# Patient Record
Sex: Male | Born: 1955
Health system: Southern US, Community
[De-identification: ages and names within clinical notes are randomized; demographics above are authoritative.]

## PROBLEM LIST (undated history)

## (undated) DIAGNOSIS — M109 Gout, unspecified: Secondary | ICD-10-CM

## (undated) DIAGNOSIS — F32A Depression, unspecified: Secondary | ICD-10-CM

## (undated) DIAGNOSIS — F329 Major depressive disorder, single episode, unspecified: Secondary | ICD-10-CM

## (undated) HISTORY — DX: Gout, unspecified: M10.9

## (undated) HISTORY — DX: Depression, unspecified: F32.A

---
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
## 2016-09-15 LAB — BASIC METABOLIC PANEL
BUN: 16 (ref 4–21)
CREATININE: 1 (ref 0.6–1.3)
Glucose: 83
POTASSIUM: 4.3 (ref 3.4–5.3)
Sodium: 141 (ref 137–147)

## 2016-09-15 LAB — LIPID PANEL
CHOLESTEROL: 155 (ref 0–200)
HDL: 50 (ref 35–70)
LDL Cholesterol: 86
TRIGLYCERIDES: 95 (ref 40–160)

## 2016-09-15 LAB — HEPATIC FUNCTION PANEL
ALT: 16 (ref 10–40)
AST: 12 — AB (ref 14–40)
Alkaline Phosphatase: 60 (ref 25–125)
Bilirubin, Total: 0.5

## 2016-09-15 LAB — HEMOGLOBIN A1C: HEMOGLOBIN A1C: 5.4

## 2017-05-22 ENCOUNTER — Ambulatory Visit: Payer: Federal, State, Local not specified - PPO | Admitting: Family Medicine

## 2017-05-22 ENCOUNTER — Encounter: Payer: Self-pay | Admitting: Family Medicine

## 2017-05-22 VITALS — BP 122/64 | HR 64 | Temp 97.7°F | Ht 68.0 in | Wt 206.0 lb

## 2017-05-22 DIAGNOSIS — M25562 Pain in left knee: Secondary | ICD-10-CM

## 2017-05-22 NOTE — Patient Instructions (Signed)
Please follow-up with Dr. Berline Choughigby to further assess your knee pain.  Please place compression to the area.  You can also use ice 2-3 times daily.  Come back soon for your annual physical.  Take care, Dr. Jimmey RalphParker
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
## 2017-05-23 ENCOUNTER — Encounter: Payer: Self-pay | Admitting: Sports Medicine

## 2017-05-23 ENCOUNTER — Ambulatory Visit: Payer: Self-pay

## 2017-05-23 ENCOUNTER — Ambulatory Visit: Payer: Federal, State, Local not specified - PPO | Admitting: Sports Medicine

## 2017-05-23 VITALS — BP 110/66 | HR 65 | Ht 68.0 in | Wt 205.2 lb

## 2017-05-23 DIAGNOSIS — S8002XA Contusion of left knee, initial encounter: Secondary | ICD-10-CM

## 2017-05-23 DIAGNOSIS — M25562 Pain in left knee: Secondary | ICD-10-CM

## 2017-05-23 DIAGNOSIS — M25569 Pain in unspecified knee: Secondary | ICD-10-CM | POA: Insufficient documentation

## 2017-05-23 MED ORDER — DICLOFENAC SODIUM 2 % TD SOLN: 1.0000 "application " | Freq: Two times a day (BID) | TRANSDERMAL | 2 refills | Status: DC

## 2017-05-23 MED ORDER — DICLOFENAC SODIUM 2 % TD SOLN: 1.0000 "application " | Freq: Two times a day (BID) | TRANSDERMAL | 0 refills | Status: AC
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
## 2017-05-23 NOTE — Procedures (Signed)
LIMITED MSK ULTRASOUND OF left knee Images were obtained and interpreted by myself, Gaspar BiddingMichael Emelda Kohlbeck, DO  Images have been saved and stored to PACS system. Images obtained on: GE S7 Ultrasound machine  FINDINGS:  Patella & Patellar Tendon: Normal with bossing of the tibial tubercle consistent with prior Osgood-Schlatter's disease with possible small component of retropatellar bursitis Quad & Quad Tendon: Normal Suprapatellar Pouch: Normal, no effusion Medial Joint Line: Degenerative bulging of the medial meniscus without overt tearing Lateral Joint Line: Superficial edema within the soft tissues as well as deep to the IT band tracking down laterally and the knee.  Lateral meniscus has significant surrounding fluid with degenerative bulging but no overt tearing Trochlea: Slight degenerative changes but minimal Posterior knee: Significant contusion with small injury to the superficial veins that decompressed quite easily.  There is a small amount of fluid around the tibial nerve    IMPRESSION:  1. Significant contusion of the left lateral and posterior knee 2. Degenerative medial meniscus 3. Degenerative lateral meniscus with potential acute component secondary to direct contusion. 4. Prior Osgood-Schlatter's disease 5. Tibial nerve contusion

## 2017-05-30 ENCOUNTER — Telehealth: Payer: Self-pay | Admitting: Family Medicine

## 2017-05-30 NOTE — Telephone Encounter (Signed)
Forwarding to Dr. Rigby to advise.  

## 2017-05-30 NOTE — Telephone Encounter (Signed)
Dr. Rigby prescribed. 

## 2017-05-30 NOTE — Telephone Encounter (Signed)
Spoke with patient and advised. Her has d/c Pennsaid and is washing his Body Helix compression sleeve to get and residual medication off of the sleeve. He will call back if sx done improve within the next 48 hours.

## 2017-05-30 NOTE — Telephone Encounter (Signed)
Please contact patient as soon as possible to advise, possible allergic reaction, [facility] See note below.

## 2017-05-30 NOTE — Telephone Encounter (Signed)
Pt. Called to report he is having a reaction to his Pennsaid topical. States he stopped it yesterday. The skin on his knee is red and peeling "like a really bad sunburn." Asking if he needs to do anything for this.

## 2017-05-30 NOTE — Telephone Encounter (Signed)
Patient should discontinue use.  Please inquire regarding how he was applying it.  He should not of been rubbing it for more than 15 seconds as this does tend to seem to exacerbate skin issues.  Some people are sensitive however and this may be a reaction to the DMSO.  He should discontinue this at this time okay to take ibuprofen over-the-counter if needed and if you would like a prescription for meloxicam we can send that in for him to help with the knee pain.  In the meantime he should use a moisturizing lotion such as Vaseline or aloe gel.  Okay to use ice as needed as well.

## 2017-06-02 ENCOUNTER — Encounter: Payer: Self-pay | Admitting: Sports Medicine

## 2017-06-19 ENCOUNTER — Encounter: Payer: Self-pay | Admitting: Physical Therapy

## 2017-06-20 ENCOUNTER — Encounter: Payer: Self-pay | Admitting: Sports Medicine

## 2017-06-20 ENCOUNTER — Ambulatory Visit (INDEPENDENT_AMBULATORY_CARE_PROVIDER_SITE_OTHER): Payer: Federal, State, Local not specified - PPO

## 2017-06-20 ENCOUNTER — Ambulatory Visit: Payer: Federal, State, Local not specified - PPO | Admitting: Sports Medicine

## 2017-06-20 VITALS — BP 110/82 | HR 62 | Ht 68.0 in | Wt 208.8 lb

## 2017-06-20 DIAGNOSIS — F4323 Adjustment disorder with mixed anxiety and depressed mood: Secondary | ICD-10-CM | POA: Diagnosis not present

## 2017-06-20 DIAGNOSIS — M25552 Pain in left hip: Secondary | ICD-10-CM | POA: Insufficient documentation

## 2017-06-20 DIAGNOSIS — S8002XD Contusion of left knee, subsequent encounter: Secondary | ICD-10-CM

## 2017-06-20 DIAGNOSIS — S79912A Unspecified injury of left hip, initial encounter: Secondary | ICD-10-CM | POA: Diagnosis not present

## 2017-06-20 DIAGNOSIS — M25562 Pain in left knee: Secondary | ICD-10-CM

## 2017-06-20 DIAGNOSIS — F330 Major depressive disorder, recurrent, mild: Secondary | ICD-10-CM | POA: Diagnosis not present

## 2017-06-20 NOTE — Assessment & Plan Note (Signed)
Knee contusion with tibial nerve irritation which is likely contributing to the posterior medial calf numbness.  This is improving.  We will have him continue with compression and follow-up in 4-6 weeks to ensure clinical resolution.  If any persistent symptoms can consider repeat imaging but anticipate good improvement with continued conservative care.
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
## 2017-06-20 NOTE — Patient Instructions (Signed)

## 2017-06-20 NOTE — Assessment & Plan Note (Signed)
Symptoms are consistent with internal snapping hip.  He has a small amount of arthritis right greater than left hip.  Start Dollar Generaloodman exercises

## 2017-06-26 DIAGNOSIS — F330 Major depressive disorder, recurrent, mild: Secondary | ICD-10-CM | POA: Diagnosis not present

## 2017-06-26 DIAGNOSIS — F4323 Adjustment disorder with mixed anxiety and depressed mood: Secondary | ICD-10-CM | POA: Diagnosis not present

## 2017-06-29 DIAGNOSIS — F330 Major depressive disorder, recurrent, mild: Secondary | ICD-10-CM | POA: Diagnosis not present

## 2017-06-29 DIAGNOSIS — F4323 Adjustment disorder with mixed anxiety and depressed mood: Secondary | ICD-10-CM | POA: Diagnosis not present

## 2017-07-13 DIAGNOSIS — F330 Major depressive disorder, recurrent, mild: Secondary | ICD-10-CM | POA: Diagnosis not present

## 2017-07-13 DIAGNOSIS — F4323 Adjustment disorder with mixed anxiety and depressed mood: Secondary | ICD-10-CM | POA: Diagnosis not present

## 2017-07-17 DIAGNOSIS — F330 Major depressive disorder, recurrent, mild: Secondary | ICD-10-CM | POA: Diagnosis not present

## 2017-07-17 DIAGNOSIS — F4323 Adjustment disorder with mixed anxiety and depressed mood: Secondary | ICD-10-CM | POA: Diagnosis not present

## 2017-07-18 ENCOUNTER — Ambulatory Visit: Payer: Federal, State, Local not specified - PPO | Admitting: Sports Medicine

## 2017-07-18 ENCOUNTER — Encounter: Payer: Self-pay | Admitting: Sports Medicine

## 2017-07-18 VITALS — BP 112/76 | HR 72 | Ht 68.0 in | Wt 207.6 lb

## 2017-07-18 DIAGNOSIS — S8002XD Contusion of left knee, subsequent encounter: Secondary | ICD-10-CM

## 2017-07-18 DIAGNOSIS — M25552 Pain in left hip: Secondary | ICD-10-CM
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
## 2017-07-19 DIAGNOSIS — F330 Major depressive disorder, recurrent, mild: Secondary | ICD-10-CM | POA: Diagnosis not present

## 2017-07-19 DIAGNOSIS — F4323 Adjustment disorder with mixed anxiety and depressed mood: Secondary | ICD-10-CM | POA: Diagnosis not present

## 2017-07-20 DIAGNOSIS — F4323 Adjustment disorder with mixed anxiety and depressed mood: Secondary | ICD-10-CM | POA: Diagnosis not present

## 2017-07-20 DIAGNOSIS — F330 Major depressive disorder, recurrent, mild: Secondary | ICD-10-CM | POA: Diagnosis not present

## 2017-07-24 DIAGNOSIS — F4323 Adjustment disorder with mixed anxiety and depressed mood: Secondary | ICD-10-CM | POA: Diagnosis not present

## 2017-07-24 DIAGNOSIS — F330 Major depressive disorder, recurrent, mild: Secondary | ICD-10-CM | POA: Diagnosis not present

## 2017-07-27 DIAGNOSIS — F330 Major depressive disorder, recurrent, mild: Secondary | ICD-10-CM | POA: Diagnosis not present

## 2017-07-27 DIAGNOSIS — F4323 Adjustment disorder with mixed anxiety and depressed mood: Secondary | ICD-10-CM | POA: Diagnosis not present

## 2017-07-31 DIAGNOSIS — F4323 Adjustment disorder with mixed anxiety and depressed mood: Secondary | ICD-10-CM | POA: Diagnosis not present

## 2017-07-31 DIAGNOSIS — F330 Major depressive disorder, recurrent, mild: Secondary | ICD-10-CM | POA: Diagnosis not present

## 2017-08-07 DIAGNOSIS — F4323 Adjustment disorder with mixed anxiety and depressed mood: Secondary | ICD-10-CM | POA: Diagnosis not present

## 2017-08-07 DIAGNOSIS — F330 Major depressive disorder, recurrent, mild: Secondary | ICD-10-CM | POA: Diagnosis not present

## 2017-08-09 DIAGNOSIS — F4323 Adjustment disorder with mixed anxiety and depressed mood: Secondary | ICD-10-CM | POA: Diagnosis not present

## 2017-08-09 DIAGNOSIS — F330 Major depressive disorder, recurrent, mild: Secondary | ICD-10-CM | POA: Diagnosis not present

## 2017-08-14 DIAGNOSIS — F330 Major depressive disorder, recurrent, mild: Secondary | ICD-10-CM | POA: Diagnosis not present

## 2017-08-14 DIAGNOSIS — F4323 Adjustment disorder with mixed anxiety and depressed mood: Secondary | ICD-10-CM | POA: Diagnosis not present

## 2017-08-21 DIAGNOSIS — F4323 Adjustment disorder with mixed anxiety and depressed mood: Secondary | ICD-10-CM | POA: Diagnosis not present

## 2017-08-21 DIAGNOSIS — F330 Major depressive disorder, recurrent, mild: Secondary | ICD-10-CM | POA: Diagnosis not present

## 2017-10-23 DIAGNOSIS — F330 Major depressive disorder, recurrent, mild: Secondary | ICD-10-CM | POA: Diagnosis not present

## 2017-10-23 DIAGNOSIS — F4323 Adjustment disorder with mixed anxiety and depressed mood: Secondary | ICD-10-CM | POA: Diagnosis not present

## 2017-10-27 DIAGNOSIS — F330 Major depressive disorder, recurrent, mild: Secondary | ICD-10-CM | POA: Diagnosis not present

## 2017-10-27 DIAGNOSIS — F4323 Adjustment disorder with mixed anxiety and depressed mood: Secondary | ICD-10-CM | POA: Diagnosis not present

## 2018-02-05 DIAGNOSIS — F4323 Adjustment disorder with mixed anxiety and depressed mood: Secondary | ICD-10-CM | POA: Diagnosis not present

## 2018-02-05 DIAGNOSIS — F330 Major depressive disorder, recurrent, mild: Secondary | ICD-10-CM | POA: Diagnosis not present

## 2018-03-08 ENCOUNTER — Encounter: Payer: Self-pay | Admitting: Sports Medicine

## 2018-03-08 ENCOUNTER — Ambulatory Visit: Payer: Federal, State, Local not specified - PPO | Admitting: Sports Medicine

## 2018-03-08 ENCOUNTER — Ambulatory Visit: Payer: Self-pay

## 2018-03-08 VITALS — BP 120/68 | HR 69 | Ht 68.0 in | Wt 213.0 lb

## 2018-03-08 DIAGNOSIS — M25461 Effusion, right knee: Secondary | ICD-10-CM | POA: Diagnosis not present

## 2018-03-08 DIAGNOSIS — M25561 Pain in right knee: Secondary | ICD-10-CM | POA: Diagnosis not present

## 2018-03-08 DIAGNOSIS — G8929 Other chronic pain: Secondary | ICD-10-CM

## 2018-03-08 NOTE — Procedures (Signed)
PROCEDURE NOTE:  Ultrasound Guided: Aspiration and Injection: Right knee Images were obtained and interpreted by myself, Gaspar Bidding, DO  Images have been saved and stored to PACS system. Images obtained on: GE S7 Ultrasound machine    ULTRASOUND FINDINGS:  Large effusion  DESCRIPTION OF PROCEDURE:  The patient's clinical condition is marked by substantial pain and/or significant functional disability. Other conservative therapy has not provided relief, is contraindicated, or not appropriate. There is a reasonable likelihood that injection will significantly improve the patient's pain and/or functional impairment.   After discussing the risks, benefits and expected outcomes of the injection and all questions were reviewed and answered, the patient wished to undergo the above named procedure.  Verbal consent was obtained.  The ultrasound was used to identify the target structure and adjacent neurovascular structures. The skin was then prepped in sterile fashion and the target structure was injected under direct visualization using sterile technique as below:  Single injection performed as below: PREP: Alcohol, Ethel Chloride and 5 cc 1% lidocaine on 25g 1.5 in. needle APPROACH:superiolateral, stopcock technique, 18g 1.5 in. INJECTATE: 2 cc 0.5% Marcaine and 2 cc 40mg /mL DepoMedrol ASPIRATE: 35mL  and straw colored  DRESSING: Band-Aid and Patients home compression device  Post procedural instructions including recommending icing and warning signs for infection were reviewed.    This procedure was well tolerated and there were no complications.   IMPRESSION: Succesful Ultrasound Guided: Aspiration and Injection
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
## 2018-03-08 NOTE — Patient Instructions (Signed)

## 2018-04-19 ENCOUNTER — Ambulatory Visit (INDEPENDENT_AMBULATORY_CARE_PROVIDER_SITE_OTHER): Payer: Federal, State, Local not specified - PPO

## 2018-04-19 ENCOUNTER — Encounter: Payer: Self-pay | Admitting: Sports Medicine

## 2018-04-19 ENCOUNTER — Ambulatory Visit: Payer: Federal, State, Local not specified - PPO | Admitting: Sports Medicine

## 2018-04-19 DIAGNOSIS — G8929 Other chronic pain: Secondary | ICD-10-CM

## 2018-04-19 DIAGNOSIS — M25561 Pain in right knee: Secondary | ICD-10-CM | POA: Diagnosis not present

## 2018-04-19 NOTE — Patient Instructions (Addendum)

## 2018-04-23 DIAGNOSIS — F4323 Adjustment disorder with mixed anxiety and depressed mood: Secondary | ICD-10-CM | POA: Diagnosis not present

## 2018-04-23 DIAGNOSIS — F330 Major depressive disorder, recurrent, mild: Secondary | ICD-10-CM | POA: Diagnosis not present

## 2018-05-10 ENCOUNTER — Ambulatory Visit
Admission: RE | Admit: 2018-05-10 | Discharge: 2018-05-10 | Disposition: A | Discharge status: Home / Self Care | Payer: Federal, State, Local not specified - PPO | Source: Ambulatory Visit | Attending: Sports Medicine | Admitting: Sports Medicine

## 2018-05-10 DIAGNOSIS — M25561 Pain in right knee: Principal | ICD-10-CM

## 2018-05-10 DIAGNOSIS — G8929 Other chronic pain: Secondary | ICD-10-CM

## 2018-05-21 ENCOUNTER — Encounter: Payer: Self-pay | Admitting: Family Medicine

## 2018-05-21 ENCOUNTER — Encounter: Payer: Self-pay | Admitting: Sports Medicine

## 2018-05-21 ENCOUNTER — Ambulatory Visit: Payer: Federal, State, Local not specified - PPO | Admitting: Sports Medicine

## 2018-05-21 ENCOUNTER — Ambulatory Visit: Payer: Self-pay

## 2018-05-21 ENCOUNTER — Ambulatory Visit (INDEPENDENT_AMBULATORY_CARE_PROVIDER_SITE_OTHER): Payer: Federal, State, Local not specified - PPO | Admitting: Family Medicine

## 2018-05-21 VITALS — BP 104/60 | HR 66 | Ht 68.0 in | Wt 210.6 lb

## 2018-05-21 VITALS — BP 110/62 | HR 67 | Temp 98.6°F | Ht 68.0 in | Wt 213.2 lb

## 2018-05-21 DIAGNOSIS — M1711 Unilateral primary osteoarthritis, right knee: Secondary | ICD-10-CM

## 2018-05-21 DIAGNOSIS — Z0001 Encounter for general adult medical examination with abnormal findings: Secondary | ICD-10-CM

## 2018-05-21 DIAGNOSIS — Z114 Encounter for screening for human immunodeficiency virus [HIV]: Secondary | ICD-10-CM | POA: Diagnosis not present

## 2018-05-21 DIAGNOSIS — Z13220 Encounter for screening for lipoid disorders: Secondary | ICD-10-CM

## 2018-05-21 DIAGNOSIS — Z125 Encounter for screening for malignant neoplasm of prostate: Secondary | ICD-10-CM

## 2018-05-21 DIAGNOSIS — Z131 Encounter for screening for diabetes mellitus: Secondary | ICD-10-CM | POA: Diagnosis not present

## 2018-05-21 DIAGNOSIS — M25461 Effusion, right knee: Secondary | ICD-10-CM | POA: Diagnosis not present

## 2018-05-21 DIAGNOSIS — S83231D Complex tear of medial meniscus, current injury, right knee, subsequent encounter: Secondary | ICD-10-CM | POA: Diagnosis not present

## 2018-05-21 DIAGNOSIS — Z1211 Encounter for screening for malignant neoplasm of colon: Secondary | ICD-10-CM

## 2018-05-21 DIAGNOSIS — Z1159 Encounter for screening for other viral diseases: Secondary | ICD-10-CM | POA: Diagnosis not present

## 2018-05-21 DIAGNOSIS — M25561 Pain in right knee: Secondary | ICD-10-CM

## 2018-05-21 DIAGNOSIS — G8929 Other chronic pain: Secondary | ICD-10-CM

## 2018-05-21 DIAGNOSIS — R002 Palpitations: Secondary | ICD-10-CM | POA: Diagnosis not present

## 2018-05-21 NOTE — Progress Notes (Signed)
Corey [name]. Corey [name] [facility] at [facility] [phone]  Corey [name] - 63 y.o. male MRN [id number]  Date of birth: [birth year]  Visit Date: 05/21/2018  PCP: Ardith Dark, MD   Referred by: Ardith Dark, MD   SUBJECTIVE:  Chief Complaint  Patient presents with  . Follow-up    R knee pain - MRI f/u    HPI: Patient is here today to follow-up for his right knee pain.  The pain has continued to be moderate in nature and is reported currently is intermittent.  Overall the swelling that he has had has decreased.  Symptoms are exacerbated with stairs and twisting type mechanism.  He has been using a Body Helix Compression Sleeve compression sleeve as well as a [employer]Joy hinged knee brace that has been beneficial.  He is here today to follow-up on the previously ordered MRI.  REVIEW OF SYSTEMS: Denies any significant nighttime disturbances.  Denies any fevers chills night sweats or overlying skin changes.  HISTORY:  Prior history reviewed and updated per electronic medical record.  Social History   Occupational History  . Not on file  Tobacco Use  . Smoking status: Never Smoker  . Smokeless tobacco: Never Used  Substance and Sexual Activity  . Alcohol use: Yes  . Drug use: No  . Sexual activity: Yes   Social History   Social History Narrative  . Not on file      DATA OBTAINED & REVIEWED:  No results for input(s): HGBA1C, LABURIC, CREATINE, CALCIUM, AST, ALT, TSH in the last 8760 hours.  Invalid input(s): MAGNESIUM, CK Problem  Knee Pain   Right knee MRI obtained showing significant areas of full-thickness cartilage loss along the medial tibial compartment.  He does have a degenerative tear of the posterior medial meniscus. Second injection performed on 05/20/2018.    No specialty comments available.  OBJECTIVE:  VS:  HT:5\' 8"  (172.7 cm)   WT:210 lb 9.6 oz (95.5 kg)  BMI:32.03    BP:104/60  HR:66bpm  TEMP: ( )   RESP:97 %   PHYSICAL EXAM: Adult male in no acute distress alert and appropriate.  His right knee has tenderness along the medial and lateral joint lines most focally along the posterior medial joint line.  He has pain with valgus testing and 3 to 4 mm of opening with a solid endpoint.   ASSESSMENT  1. Chronic pain of right knee   2. Effusion of right knee   3. Complex tear of medial meniscus of right knee as current injury, subsequent encounter   4. Primary osteoarthritis of right knee     PLAN:  Pertinent additional documentation may be included in corresponding procedure notes, imaging studies, problem based documentation and patient instructions.  Procedures:  US Guided Injection per procedure note  Medications:  No orders of the defined types were placed in this encounter.   Discussion/Instructions: No problem-specific Assessment & Plan notes found for this encounter.   Long discussion today regarding options including referral for surgical consideration versus repeat injection today.  Given the findings and this is multifactorial from breath and arthritic and from a soft tissue/meniscus component repeat aspiration and injection performed per procedure note.  Ultimately understands that if this is only providing minimal relief that consideration of arthroscopy would be the next test/intervention to consider but he ultimately may likely require total knee arthroplasty at some point down the road due to the underlying degenerative nature of his  pain.  If any lack of improvement: consider referral to Orthopedics for Consideration of arthroscopy.  At follow up will plan: to consider Zilretta (sustained release Triamcinolone) - authorization obtained today Return in about 8 weeks (around 07/16/2018).          Andrena MewsMichael D Achaia Garlock, DO    [facility] Physician

## 2018-05-21 NOTE — Procedures (Signed)
PROCEDURE NOTE:  Ultrasound Guided: Aspiration and Injection: Right knee, Intra-articular Images were obtained and interpreted by myself, Gaspar Bidding, DO  Images have been saved and stored to PACS system. Images obtained on: GE S7 Ultrasound machine    ULTRASOUND FINDINGS:  Moderate effusion with generalized synovitis that is fairly profound.  DESCRIPTION OF PROCEDURE:  The patient's clinical condition is marked by substantial pain and/or significant functional disability. Other conservative therapy has not provided relief, is contraindicated, or not appropriate. There is a reasonable likelihood that injection will significantly improve the patient's pain and/or functional impairment.   After discussing the risks, benefits and expected outcomes of the injection and all questions were reviewed and answered, the patient wished to undergo the above named procedure.  Verbal consent was obtained.  The ultrasound was used to identify the target structure and adjacent neurovascular structures. The skin was then prepped in sterile fashion and the target structure was injected under direct visualization using sterile technique as below:  Single injection performed as below: PREP: Alcohol, Ethel Chloride and 5 cc 1% lidocaine on 25g 1.5 in. needle APPROACH:superiolateral, stopcock technique, 22g 1.5 in. INJECTATE: 2 cc 0.5% Marcaine and 2 cc 40mg /mL DepoMedrol ASPIRATE: 33mL  and straw colored  DRESSING: Band-Aid  Post procedural instructions including recommending icing and warning signs for infection were reviewed.    This procedure was well tolerated and there were no complications.   IMPRESSION: Succesful Ultrasound Guided: Aspiration and Injection

## 2018-05-21 NOTE — Patient Instructions (Addendum)

## 2018-05-21 NOTE — Assessment & Plan Note (Addendum)
No red flags.  No current symptoms.  Given that he has not had any symptoms for 2 weeks, he declined EKG today.  We will check CBC, CMET, and TSH.  Discussed strict reasons to return to care and seek emergent care.  Discussed importance of caffeine and alcohol avoidance.  Recommended good oral hydration.

## 2018-05-21 NOTE — Patient Instructions (Signed)
It was very nice to see you today!  We will check blood work today.  You will be contacted about the cologuard for colon cancer screening.   Please let me know if your palpitations return.  This is most likely due to dehydration and caffeine.  We will check blood work today to make sure there are no other causes.    Obtain twice as many fruits/vegetables as protein or carbohydrate foods for both lunch and dinner.   Cut down on sweet beverages. This includes juice, soda, and sweet tea.    Exercise at least 150 minutes every week.   Come back to see me in 1 year for your physical, or sooner as needed.  Take care, Dr Parker   Preventive Care 40-64 Years, Male Preventive care refers to lifestyle choices and visits with your health care provider that can promote health and wellness. What does preventive care include?   A yearly physical exam. This is also called an annual well check.  Dental exams once or twice a year.  Routine eye exams. Ask your health care provider how often you should have your eyes checked.  Personal lifestyle choices, including: ? Daily care of your teeth and gums. ? Regular physical activity. ? Eating a healthy diet. ? Avoiding tobacco and drug use. ? Limiting alcohol use. ? Practicing safe sex. ? Taking low-dose aspirin every day starting at age 50. What happens during an annual well check? The services and screenings done by your health care provider during your annual well check will depend on your age, overall health, lifestyle risk factors, and family history of disease. Counseling Your health care provider may ask you questions about your:  Alcohol use.  Tobacco use.  Drug use.  Emotional well-being.  Home and relationship well-being.  Sexual activity.  Eating habits.  Work and work environment. Screening You may have the following tests or measurements:  Height, weight, and BMI.  Blood pressure.  Lipid and cholesterol  levels. These may be checked every 5 years, or more frequently if you are over 50 years old.  Skin check.  Lung cancer screening. You may have this screening every year starting at age 55 if you have a 30-pack-year history of smoking and currently smoke or have quit within the past 15 years.  Colorectal cancer screening. All adults should have this screening starting at age 50 and continuing until age 75. Your health care provider may recommend screening at age 45. You will have tests every 1-10 years, depending on your results and the type of screening test. People at increased risk should start screening at an earlier age. Screening tests may include: ? Guaiac-based fecal occult blood testing. ? Fecal immunochemical test (FIT). ? Stool DNA test. ? Virtual colonoscopy. ? Sigmoidoscopy. During this test, a flexible tube with a tiny camera (sigmoidoscope) is used to examine your rectum and lower colon. The sigmoidoscope is inserted through your anus into your rectum and lower colon. ? Colonoscopy. During this test, a long, thin, flexible tube with a tiny camera (colonoscope) is used to examine your entire colon and rectum.  Prostate cancer screening. Recommendations will vary depending on your family history and other risks.  Hepatitis C blood test.  Hepatitis B blood test.  Sexually transmitted disease (STD) testing.  Diabetes screening. This is done by checking your blood sugar (glucose) after you have not eaten for a while (fasting). You may have this done every 1-3 years. Discuss your test results, treatment options, and   if necessary, the need for more tests with your health care provider. Vaccines Your health care provider may recommend certain vaccines, such as:  Influenza vaccine. This is recommended every year.  Tetanus, diphtheria, and acellular pertussis (Tdap, Td) vaccine. You may need a Td booster every 10 years.  Varicella vaccine. You may need this if you have not been  vaccinated.  Zoster vaccine. You may need this after age 60.  Measles, mumps, and rubella (MMR) vaccine. You may need at least one dose of MMR if you were born in 1957 or later. You may also need a second dose.  Pneumococcal 13-valent conjugate (PCV13) vaccine. You may need this if you have certain conditions and have not been vaccinated.  Pneumococcal polysaccharide (PPSV23) vaccine. You may need one or two doses if you smoke cigarettes or if you have certain conditions.  Meningococcal vaccine. You may need this if you have certain conditions.  Hepatitis A vaccine. You may need this if you have certain conditions or if you travel or work in places where you may be exposed to hepatitis A.  Hepatitis B vaccine. You may need this if you have certain conditions or if you travel or work in places where you may be exposed to hepatitis B.  Haemophilus influenzae type b (Hib) vaccine. You may need this if you have certain risk factors. Talk to your health care provider about which screenings and vaccines you need and how often you need them. This information is not intended to replace advice given to you by your health care provider. Make sure you discuss any questions you have with your health care provider. Document Released: 05/29/2015 Document Revised: 06/22/2017 Document Reviewed: 03/03/2015 Elsevier Interactive Patient Education  2019 Elsevier Inc.  

## 2018-05-21 NOTE — Progress Notes (Signed)
Subjective:  Corey [name] is a 63 y.o. male who presents today for his annual comprehensive physical exam.    HPI:  He has no acute complaints today.   He has one other concern today: 1.  Palpitations Patient had one episode of palpitations that lasted for a couple of hours and then subsided.  Described it as a funny sensation in his chest.  No chest pain or shortness of breath.  No syncope.  Patient was out of town and had quite a bit of caffeine prior to the episode.  He has had palpitations in the past which was spontaneously resolved.  No symptoms since then.  Lifestyle Diet: No specific diets. Interested in intermittent fasting.  Exercise: No specific exercises due to knee problems.   No flowsheet data found.  Health Maintenance Due  Topic Date Due  . Hepatitis C Screening  05-Mar-1956  . HIV Screening  10/12/1970  . COLONOSCOPY  10/11/2005    ROS: Per HPI, otherwise a complete review of systems was negative.   PMH:  The following were reviewed and entered/updated in epic: History reviewed. No pertinent past medical history. Patient Active Problem List   Diagnosis Date Noted  . Palpitation 05/21/2018  . Left hip pain 06/20/2017  . Knee pain 05/23/2017   History reviewed. No pertinent surgical history.  History reviewed. No pertinent family history.  Medications- reviewed and updated Current Outpatient Medications  Medication Sig Dispense Refill  . busPIRone (BUSPAR) 10 MG tablet Take 10 mg by mouth 3 (three) times daily as needed.    . clonazePAM (KLONOPIN) 0.5 MG tablet Take 0.5 mg by mouth every 8 (eight) hours as needed for anxiety.    . hydrOXYzine (ATARAX/VISTARIL) 25 MG tablet Take 25 mg by mouth 3 (three) times daily as needed.    . sildenafil (VIAGRA) 100 MG tablet Take 100 mg by mouth daily as needed for erectile dysfunction.     No current facility-administered medications for this visit.     Allergies-reviewed and updated No Known  Allergies  Social History   Socioeconomic History  . Marital status: Married    Spouse name: Not on file  . Number of children: Not on file  . Years of education: Not on file  . Highest education level: Not on file  Occupational History  . Not on file  Social Needs  . Financial resource strain: Not on file  . Food insecurity:    Worry: Not on file    Inability: Not on file  . Transportation needs:    Medical: Not on file    Non-medical: Not on file  Tobacco Use  . Smoking status: Never Smoker  . Smokeless tobacco: Never Used  Substance and Sexual Activity  . Alcohol use: Yes  . Drug use: No  . Sexual activity: Yes  Lifestyle  . Physical activity:    Days per week: Not on file    Minutes per session: Not on file  . Stress: Not on file  Relationships  . Social connections:    Talks on phone: Not on file    Gets together: Not on file    Attends religious service: Not on file    Active member of club or organization: Not on file    Attends meetings of clubs or organizations: Not on file    Relationship status: Not on file  Other Topics Concern  . Not on file  Social History Narrative  . Not on file    Objective:  Physical Exam: BP 110/62 (BP Location: Left Arm, Patient Position: Sitting, Cuff Size: Normal)   Pulse 67   Temp 98.6 F (37 C) (Oral)   Ht 5\' 8"  (1.727 m)   Wt 213 lb 4 oz (96.7 kg)   SpO2 97%   BMI 32.42 kg/m   Body mass index is 32.42 kg/m. Wt Readings from Last 3 Encounters:  05/21/18 213 lb 4 oz (96.7 kg)  05/21/18 210 lb 9.6 oz (95.5 kg)  04/19/18 216 lb 12.8 oz (98.3 kg)   Gen: NAD, resting comfortably HEENT: TMs normal bilaterally. OP clear. No thyromegaly noted.  CV: RRR with no murmurs appreciated Pulm: NWOB, CTAB with no crackles, wheezes, or rhonchi GI: Normal bowel sounds present. Soft, Nontender, Nondistended. MSK: no edema, cyanosis, or clubbing noted Skin: warm, dry Neuro: CN2-12 grossly intact. Strength 5/5 in upper and  lower extremities. Reflexes symmetric and intact bilaterally.  Psych: Normal affect and thought content  Assessment/Plan:  Palpitation No red flags.  No current symptoms.  Given that he has not had any symptoms for 2 weeks, he declined EKG today.  We will check CBC, CMET, and TSH.  Discussed strict reasons to return to care and seek emergent care.  Discussed importance of caffeine and alcohol avoidance.  Recommended good oral hydration.  Preventative Healthcare: Check lipid panel, A1c, PSA, hepatitis C antibody, HIV antibody.  Check Cologuard.  Flu shot declined.  Patient Counseling(The following topics were reviewed and/or handout was given):  -Nutrition: Stressed importance of moderation in sodium/caffeine intake, saturated fat and cholesterol, caloric balance, sufficient intake of fresh fruits, vegetables, and fiber.  -Stressed the importance of regular exercise.   -Substance Abuse: Discussed cessation/primary prevention of tobacco, alcohol, or other drug use; driving or other dangerous activities under the influence; availability of treatment for abuse.   -Injury prevention: Discussed safety belts, safety helmets, smoke detector, smoking near bedding or upholstery.   -Sexuality: Discussed sexually transmitted diseases, partner selection, use of condoms, avoidance of unintended pregnancy and contraceptive alternatives.   -Dental health: Discussed importance of regular tooth brushing, flossing, and dental visits.  -Health maintenance and immunizations reviewed. Please refer to Health maintenance section.  Return to care in 1 year for next preventative visit.   Katina Degreealeb M. Jimmey RalphParker, MD 05/21/2018 5:13 PM

## 2018-05-22 LAB — COMPREHENSIVE METABOLIC PANEL
ALT: 21 U/L (ref 0–53)
AST: 19 U/L (ref 0–37)
Albumin: 4.4 g/dL (ref 3.5–5.2)
Alkaline Phosphatase: 67 U/L (ref 39–117)
BILIRUBIN TOTAL: 0.5 mg/dL (ref 0.2–1.2)
BUN: 10 mg/dL (ref 6–23)
CALCIUM: 9.8 mg/dL (ref 8.4–10.5)
CHLORIDE: 101 meq/L (ref 96–112)
CO2: 28 meq/L (ref 19–32)
Creatinine, Ser: 1.1 mg/dL (ref 0.40–1.50)
GFR: 71.95 mL/min (ref >60.00)
GLUCOSE: 133 mg/dL — AB (ref 70–99)
POTASSIUM: 4.2 meq/L (ref 3.5–5.1)
Sodium: 138 mEq/L (ref 135–145)
Total Protein: 7.1 g/dL (ref 6.0–8.3)

## 2018-05-22 LAB — CBC
HCT: 45 % (ref 39.0–52.0)
HEMOGLOBIN: 15.3 g/dL (ref 13.0–17.0)
MCHC: 34 g/dL (ref 30.0–36.0)
MCV: 94.8 fl (ref 78.0–100.0)
Platelets: 277 10*3/uL (ref 150.0–400.0)
RBC: 4.75 Mil/uL (ref 4.22–5.81)
RDW: 13 % (ref 11.5–15.5)
WBC: 7.1 10*3/uL (ref 4.0–10.5)

## 2018-05-22 LAB — LIPID PANEL
Cholesterol: 161 mg/dL (ref 0–200)
HDL: 53.6 mg/dL (ref >39.00)
LDL Cholesterol: 83 mg/dL (ref 0–99)
NonHDL: 106.92
Total CHOL/HDL Ratio: 3
Triglycerides: 120 mg/dL (ref 0.0–149.0)
VLDL: 24 mg/dL (ref 0.0–40.0)

## 2018-05-22 LAB — HEPATITIS C ANTIBODY
Hepatitis C Ab: NONREACTIVE
SIGNAL TO CUT-OFF: 0.01 (ref <1.00)

## 2018-05-22 LAB — HEMOGLOBIN A1C: HEMOGLOBIN A1C: 5.8 % (ref 4.6–6.5)

## 2018-05-22 LAB — PSA: PSA: 1.13 ng/mL (ref 0.10–4.00)

## 2018-05-22 LAB — HIV ANTIBODY (ROUTINE TESTING W REFLEX): HIV 1&2 Ab, 4th Generation: NONREACTIVE

## 2018-05-22 LAB — TSH: TSH: 0.78 u[IU]/mL (ref 0.35–4.50)

## 2018-05-23 ENCOUNTER — Telehealth: Payer: Self-pay | Admitting: Family Medicine

## 2018-05-23 NOTE — Telephone Encounter (Signed)
Results given and documented in result note. 

## 2018-05-23 NOTE — Progress Notes (Signed)
Please inform patient of the following: All of his blood work is normal.  Blood counts are normal.  Electrolytes, kidney function, liver function, and blood sugar levels are normal. Cholesterol levels are normal. Hepatitis C and HIV tests are negative. Thyroid level is normal.  PSA is normal.  Will contact him with cologuard once we receive results.  Katina Degree. Jimmey Ralph, MD 05/23/2018 7:59 AM

## 2018-05-23 NOTE — Telephone Encounter (Unsigned)
Copied from CRM [id number]. Topic: General - Other >> May 23, 2018  3:44 PM Marylen Ponto wrote: Reason for CRM: Pt returned call for lab results. Pt requests call back. Cb# [phone]

## 2018-06-01 ENCOUNTER — Encounter: Payer: Self-pay | Admitting: Sports Medicine

## 2018-06-14 ENCOUNTER — Ambulatory Visit: Payer: Federal, State, Local not specified - PPO | Admitting: Family Medicine

## 2018-06-14 ENCOUNTER — Encounter: Payer: Self-pay | Admitting: Family Medicine

## 2018-06-14 VITALS — BP 110/60 | HR 68 | Temp 98.9°F | Ht 68.0 in | Wt 209.2 lb

## 2018-06-14 DIAGNOSIS — M79675 Pain in left toe(s): Secondary | ICD-10-CM

## 2018-06-14 LAB — URIC ACID: Uric Acid, Serum: 7.1 mg/dL (ref 4.0–7.8)

## 2018-06-14 MED ORDER — PREDNISONE 50 MG PO TABS: ORAL_TABLET | ORAL | 0 refills | Status: DC

## 2018-06-14 MED ORDER — COLCHICINE 0.6 MG PO CAPS: ORAL_CAPSULE | ORAL | 0 refills | Status: DC

## 2018-06-14 NOTE — Patient Instructions (Signed)
It was very nice to see you today!  Please start the colchicine and prednisone.  We will check blood work today.  Let me know if not improving in a few days.   Take care, Dr Jimmey RalphParker   Low-Purine Eating Plan A low-purine eating plan involves making food choices to limit your intake of purine. Purine is a kind of uric acid. Too much uric acid in your blood can cause certain conditions, such as gout and kidney stones. Eating a low-purine diet can help control these conditions. What are tips for following this plan? Reading food labels   Avoid foods with saturated or Trans fat.  Check the ingredient list of grains-based foods, such as bread and cereal, to make sure that they contain whole grains.  Check the ingredient list of sauces or soups to make sure they do not contain meat or fish.  When choosing soft drinks, check the ingredient list to make sure they do not contain high-fructose corn syrup. Shopping  Buy plenty of fresh fruits and vegetables.  Avoid buying canned or fresh fish.  Buy dairy products labeled as low-fat or nonfat.  Avoid buying premade or processed foods. These foods are often high in fat, salt (sodium), and added sugar. Cooking  Use olive oil instead of butter when cooking. Oils like olive oil, canola oil, and sunflower oil contain healthy fats. Meal planning  Learn which foods do or do not affect you. If you find out that a food tends to cause your gout symptoms to flare up, avoid eating that food. You can enjoy foods that do not cause problems. If you have any questions about a food item, talk with your dietitian or health care provider.  Limit foods high in fat, especially saturated fat. Fat makes it harder for your body to get rid of uric acid.  Choose foods that are lower in fat and are lean sources of protein. General guidelines  Limit alcohol intake to no more than 1 drink a day for nonpregnant women and 2 drinks a day for men. One drink equals 12  oz of beer, 5 oz of wine, or 1 oz of hard liquor. Alcohol can affect the way your body gets rid of uric acid.  Drink plenty of water to keep your urine clear or pale yellow. Fluids can help remove uric acid from your body.  If directed by your health care provider, take a vitamin C supplement.  Work with your health care provider and dietitian to develop a plan to achieve or maintain a healthy weight. Losing weight can help reduce uric acid in your blood. What foods are recommended? The items listed may not be a complete list. Talk with your dietitian about what dietary choices are best for you. Foods low in purines Foods low in purines do not need to be limited. These include:  All fruits.  All low-purine vegetables, pickles, and olives.  Breads, pasta, rice, cornbread, and popcorn. Cake and other baked goods.  All dairy foods.  Eggs, nuts, and nut butters.  Spices and condiments, such as salt, herbs, and vinegar.  Plant oils, butter, and margarine.  Water, sugar-free soft drinks, tea, coffee, and cocoa.  Vegetable-based soups, broths, sauces, and gravies. Foods moderate in purines Foods moderate in purines should be limited to the amounts listed.   cup of asparagus, cauliflower, spinach, mushrooms, or green peas, each day.  2/3 cup uncooked oatmeal, each day.   cup dry wheat bran or wheat germ, each day.  2-3 ounces of meat or poultry, each day.  4-6 ounces of shellfish, such as crab, lobster, oysters, or shrimp, each day.  1 cup cooked beans, peas, or lentils, each day.  Soup, broths, or bouillon made from meat or fish. Limit these foods as much as possible. What foods are not recommended? The items listed may not be a complete list. Talk with your dietitian about what dietary choices are best for you. Limit your intake of foods high in purines, including:  Beer and other alcohol.  Meat-based gravy or sauce.  Canned or fresh fish, such as: ? Anchovies,  sardines, herring, and tuna. ? Mussels and scallops. ? Codfish, trout, and haddock.  Tomasa Blase.  Organ meats, such as: ? Liver or kidney. ? Tripe. ? Sweetbreads (thymus gland or pancreas).  Wild Education officer, environmental.  Yeast or yeast extract supplements.  Drinks sweetened with high-fructose corn syrup. Summary  Eating a low-purine diet can help control conditions caused by too much uric acid in the body, such as gout or kidney stones.  Choose low-purine foods, limit alcohol, and limit foods high in fat.  You will learn over time which foods do or do not affect you. If you find out that a food tends to cause your gout symptoms to flare up, avoid eating that food. This information is not intended to replace advice given to you by your health care provider. Make sure you discuss any questions you have with your health care provider. Document Released: 08/27/2010 Document Revised: 06/15/2016 Document Reviewed: 06/15/2016 Elsevier Interactive Patient Education  2019 [employer].

## 2018-06-14 NOTE — Progress Notes (Signed)
   Chief Complaint:  Corey SkeansMichael [name] is a 63 y.o. male who presents for same day appointment with a chief complaint of foot pain.   Assessment/Plan:  Gout History and exam consistent with gout flare.  Will start colchicine and prednisone today.  Will check uric acid level-discussed with patient that levels may be falsely low during an acute flare.  Depending on results may need to start allopurinol.  Discussed low purine diet.  Discussed reasons to return to care.  Follow-up as needed.    Subjective:  HPI:  Foot Pain, acute problem Started 2 days ago. Stable over that time.  No injury or other obvious precipitating events. Pain located to left big toe.  Pain feels similar nature to previous gout attacks.  He had about 5 beers 4 days ago but does not think that this caused the symptoms.  He has previously been able to drink alcohol without any issue.  No specific treatments tried.  Pain is worse with movement.  No other obvious alleviating or aggravating factors.  No fever or chills.  No drainage.  ROS: Per HPI  PMH: He reports that he has never smoked. He has never used smokeless tobacco. He reports current alcohol use. He reports that he does not use drugs.      Objective:  Physical Exam: BP 110/60 (BP Location: Left Arm, Patient Position: Sitting, Cuff Size: Normal)   Pulse 68   Temp 98.9 F (37.2 C) (Oral)   Ht 5\' 8"  (1.727 m)   Wt 209 lb 3.2 oz (94.9 kg)   SpO2 97%   BMI 31.81 kg/m   Gen: NAD, resting comfortably MSK -Left foot: Left first MTP joint grossly edematous and erythematous.  Tender to palpation.  Neurovascular intact distally.     Katina Degreealeb M. Jimmey RalphParker, MD 06/14/2018 10:26 AM

## 2018-06-15 NOTE — Progress Notes (Signed)
Please inform patient of the following:  Gout level was slightly elevated. Would like for him to start allopurinol 100mg  daily once his currently flare resolves. He can come back in 3-6 months to recheck.  Corey [name]. Jimmey Ralph, MD 06/15/2018 12:42 PM

## 2018-06-18 LAB — COLOGUARD: Cologuard: NEGATIVE

## 2018-06-19 ENCOUNTER — Encounter: Payer: Self-pay | Admitting: Family Medicine

## 2018-06-21 ENCOUNTER — Encounter: Payer: Self-pay | Admitting: Sports Medicine

## 2018-06-21 NOTE — Progress Notes (Signed)
Corey [name]. [name] [id number]  Corey [name] - 63 y.o. male MRN [id number]  Date of birth: [birth year]  Visit Date: 04/19/2018  PCP: Corey Dark, MD   Referred by: Corey Dark, MD  SUBJECTIVE:   Chief Complaint  Patient presents with  . Follow-up    R knee pain    HPI: Patient presents after undergoing an injection on 03/08/2018.  He had no significant provement in his symptoms.  Continues to have pain is rated as a 3-4 out of 10.  Occasionally does get up to severe up to an 8 out of 10.  Continues to have swelling and pain.  He does notice that seems to be larger in his left knee is measured a little over 17 inches in diameter in his right knee is measuring 18-1/4 inches.  He has mild swelling.  It is worse with going up and down steps squatting and walking.  Body Helix Compression Sleeve compression sleeve and injection once again are only moderately helpful.  REVIEW OF SYSTEMS: Denies fevers, chills, recent weight gain or weight loss.  No night sweats. No significant nighttime awakenings due to this issue. Pt denies any change in bowel or bladder habits, muscle weakness, numbness or falls associated with this pain.  HISTORY:  Prior history reviewed and updated per electronic medical record.  Patient Active Problem List   Diagnosis Date Noted  . Palpitation 05/21/2018  . Left hip pain 06/20/2017    Symptoms are consistent with internal snapping hip.  He has a small amount of arthritis right greater than left hip.   . Knee pain 05/23/2017    Right knee MRI obtained showing significant areas of full-thickness cartilage loss along the medial tibial compartment.  He does have a degenerative tear of the posterior medial meniscus. Second injection performed on 05/20/2018.    Social History   Occupational History  . Not on file  Tobacco Use  . Smoking status: Never Smoker  . Smokeless tobacco: Never  Used  Substance and Sexual Activity  . Alcohol use: Yes  . Drug use: No  . Sexual activity: Yes   Social History   Social History Narrative  . Not on file    OBJECTIVE:  VS:  HT:5\' 8"  (172.7 cm)   WT:216 lb 12.8 oz (98.3 kg)  BMI:32.97    BP:104/62  HR:66bpm  TEMP: ( )  RESP:95 %   PHYSICAL EXAM: Right knee is well aligned but he does have a moderate amount of swelling.  He has a small effusion with generalized synovitis.  Pain with McMurray's.  He has a slight extensor lag by 5 degrees.   ASSESSMENT:   1. Chronic pain of right knee     PROCEDURES:      PLAN:  Pertinent additional documentation may be included in corresponding procedure notes, imaging studies, problem based documentation and patient instructions.  No problem-specific Assessment & Plan notes found for this encounter.   Concern for potential meniscal tear given the overall normal appearance of the x-rays.  Further diagnostic evaluation with MRI indicated at this time  Activity modifications and the importance of avoiding exacerbating activities (limiting pain to no more than a 4 / 10 during or following activity) recommended and discussed.  Discussed red flag symptoms that warrant earlier emergent evaluation and patient voices understanding.   No orders of the defined types were placed in this encounter.  Lab Orders  No  laboratory test(s) ordered today   Imaging Orders     DG Knee 1-2 Views Right     MR Knee Right Wo Contrast Referral Orders  No referral(s) requested today    Return for R knee MRI results review in the office.          Andrena MewsMichael D Rigby, DO    [facility] Physician

## 2018-06-22 ENCOUNTER — Other Ambulatory Visit: Payer: Self-pay

## 2018-06-22 MED ORDER — ALLOPURINOL 100 MG PO TABS: 100.0000 mg | ORAL_TABLET | Freq: Every day | ORAL | 3 refills | Status: DC

## 2018-07-16 ENCOUNTER — Ambulatory Visit: Payer: Self-pay

## 2018-07-16 ENCOUNTER — Ambulatory Visit: Payer: Federal, State, Local not specified - PPO | Admitting: Sports Medicine

## 2018-07-16 ENCOUNTER — Encounter: Payer: Self-pay | Admitting: Sports Medicine

## 2018-07-16 VITALS — BP 108/70 | HR 67 | Ht 68.0 in | Wt 212.2 lb

## 2018-07-16 DIAGNOSIS — S83231D Complex tear of medial meniscus, current injury, right knee, subsequent encounter: Secondary | ICD-10-CM | POA: Diagnosis not present

## 2018-07-16 DIAGNOSIS — M1711 Unilateral primary osteoarthritis, right knee: Secondary | ICD-10-CM

## 2018-07-16 DIAGNOSIS — M25461 Effusion, right knee: Secondary | ICD-10-CM

## 2018-07-16 DIAGNOSIS — G8929 Other chronic pain: Secondary | ICD-10-CM | POA: Diagnosis not present

## 2018-07-16 DIAGNOSIS — M25561 Pain in right knee: Secondary | ICD-10-CM | POA: Diagnosis not present

## 2018-07-16 NOTE — Patient Instructions (Addendum)

## 2018-07-16 NOTE — Progress Notes (Signed)
Corey [name]. [name] at [facility] [phone]  Corey [name] - 63 y.o. male MRN [id number]  Date of birth: 1955/11/10  Visit Date: July 16, 2018  PCP: Corey Dark, MD   Referred by: Corey Dark, MD  SUBJECTIVE:  Chief Complaint  Patient presents with  . Follow-up    R knee pain.  Steroid injections on 05/21/18 and 03/08/18.  Body Helix and hinged knee brace    HPI: Patient is here for follow-up of right knee pain and discomfort.  He has been able to go skiing with no significant complaints.  Occasional day-to-day discomfort.  He reports the injection seem to wear off after almost exactly 10 days he has had continued relief overall.  No locking or giving way.  He has continued to use the [employer] hinged knee brace with good improvement in his day-to-day activities and function.  REVIEW OF SYSTEMS: No significant nighttime awakenings due to this issue. Denies fevers, chills, recent weight gain or weight loss.  No night sweats.  Pt denies any change in bowel or bladder habits, muscle weakness, numbness or falls associated with this pain.  HISTORY:  Prior history reviewed and updated per electronic medical record.  Patient Active Problem List   Diagnosis Date Noted  . Palpitation 05/21/2018  . Left hip pain 06/20/2017    Symptoms are consistent with internal snapping hip.  He has a small amount of arthritis right greater than left hip.   . Knee pain 05/23/2017    Right knee MRI obtained showing significant areas of full-thickness cartilage loss along the medial tibial compartment.  He does have a degenerative tear of the posterior medial meniscus. Second injection performed on 05/20/2018.  R knee XR - 04/19/18    Social History   Occupational History  . Not on file  Tobacco Use  . Smoking status: Never Smoker  . Smokeless tobacco: Never Used  Substance and Sexual Activity  . Alcohol use: Yes  . Drug use: No  .  Sexual activity: Yes   Social History   Social History Narrative  . Not on file    OBJECTIVE:  VS:  HT:5\' 8"  (172.7 cm)   WT:212 lb 3.2 oz (96.3 kg)  BMI:32.27    BP:108/70  HR:67bpm  TEMP: ( )  RESP:94 %   PHYSICAL EXAM: Adult male. No acute distress.  Alert and appropriate. Right knee is well aligned.  He has a small supraphysiologic effusion on the right compared to the left.  He is ligamentously stable.  Small amount of pain with McMurray's.  Full knee range of motion.   ASSESSMENT:  1. Chronic pain of right knee   2. Effusion of right knee   3. Complex tear of medial meniscus of right knee as current injury, subsequent encounter   4. Primary osteoarthritis of right knee     PROCEDURES:  US Guided Injection per procedure note       PLAN:  Pertinent additional documentation may be included in corresponding procedure notes, imaging studies, problem based documentation and patient instructions.  No problem-specific Assessment & Plan notes found for this encounter.   Overall he is done quite well.  He has been increasing his function is actually able to go skiing without any significant issues with his knee.  He has continued to wear his knee brace on a regular basis with day-to-day activities and did note that when he did not have this on the  other day that he did feel some catching of his knee described as bone-on-bone contact but this has not been recurrent.  He denies any true mechanical symptoms.  He should do well with Zilretta injection given the great improvement that he had while immediately following the intra-articular steroid injection.  If any lack of improvement could consider referral for arthroscopy.  Home Therapeutic exercises prescribed today per procedure note.  Activity modifications and the importance of avoiding exacerbating activities (limiting pain to no more than a 4 / 10 during or following activity) recommended and discussed.   Discussed red  flag symptoms that warrant earlier emergent evaluation and patient voices understanding.    No orders of the defined types were placed in this encounter.  Lab Orders  No laboratory test(s) ordered today    Imaging Orders     [place] [facility] POCT ULTRASOUND Referral Orders  No referral(s) requested today    Return in about 13 weeks (around 10/15/2018).          Andrena Mews, DO    [facility]

## 2018-07-16 NOTE — Procedures (Signed)
PROCEDURE NOTE:  Ultrasound Guided: Injection: Right knee, Zilretta injection Images were obtained and interpreted by myself, Gaspar Bidding, DO  Images have been saved and stored to PACS system. Images obtained on: GE S7 Ultrasound machine    ULTRASOUND FINDINGS:  Moderate synovitis with small to moderate effusion.  DESCRIPTION OF PROCEDURE:  The patient's clinical condition is marked by substantial pain and/or significant functional disability. Other conservative therapy has not provided relief, is contraindicated, or not appropriate. There is a reasonable likelihood that injection will significantly improve the patient's pain and/or functional impairment.   After discussing the risks, benefits and expected outcomes of the injection and all questions were reviewed and answered, the patient wished to undergo the above named procedure.  Verbal consent was obtained.  The ultrasound was used to identify the target structure and adjacent neurovascular structures. The skin was then prepped in sterile fashion and the target structure was injected under direct visualization using sterile technique as below:  Single injection performed as below: PREP: Alcohol and Ethel Chloride APPROACH:superiolateral, single injection, 21g 2 in. INJECTATE: 5cc Zilretta (32mg /3mL Sustained Release Triamcinolone) ASPIRATE: None DRESSING: Band-Aid  Post procedural instructions including recommending icing and warning signs for infection were reviewed.    This procedure was well tolerated and there were no complications.   IMPRESSION: Succesful Ultrasound Guided: Injection

## 2018-07-23 DIAGNOSIS — F4323 Adjustment disorder with mixed anxiety and depressed mood: Secondary | ICD-10-CM | POA: Diagnosis not present

## 2018-07-23 DIAGNOSIS — F330 Major depressive disorder, recurrent, mild: Secondary | ICD-10-CM | POA: Diagnosis not present

## 2018-09-11 ENCOUNTER — Other Ambulatory Visit: Payer: Self-pay | Admitting: Family Medicine

## 2018-09-17 ENCOUNTER — Ambulatory Visit: Payer: Federal, State, Local not specified - PPO | Admitting: Sports Medicine

## 2018-09-19 ENCOUNTER — Other Ambulatory Visit: Payer: Self-pay

## 2018-09-19 ENCOUNTER — Ambulatory Visit: Payer: Federal, State, Local not specified - PPO | Admitting: Family Medicine

## 2018-09-19 ENCOUNTER — Encounter: Payer: Self-pay | Admitting: Family Medicine

## 2018-09-19 DIAGNOSIS — S83206A Unspecified tear of unspecified meniscus, current injury, right knee, initial encounter: Secondary | ICD-10-CM | POA: Insufficient documentation

## 2018-09-19 DIAGNOSIS — S83241D Other tear of medial meniscus, current injury, right knee, subsequent encounter: Secondary | ICD-10-CM | POA: Diagnosis not present

## 2018-09-19 NOTE — Progress Notes (Signed)
[facility] [address] [place], [place] [address] Phone: [phone] Subjective:   I Ronelle Nigh am serving as a Neurosurgeon for Dr. Antoine Primas.  I'm seeing this patient by the request  of:    CC: Right knee pain follow-up  DYJ:[id number]  Corey [name] is a 63 y.o. male coming in with complaint of right knee pain. States his knee is doing well. Pain is from an accident in 2018. Hip pointer on the left side is painful. Has never had an injection. Berline Chough gave stretches for the hip.     Patient did have an MRI of the knee done previously in the summer 2019 showing the patient did have a tiny meniscal tear noted.  The patient was given a long-acting steroid injection was done very well.  No pain at the moment.  No locking or giving out on him.  No past medical history on file. No past surgical history on file. Social History   Socioeconomic History  . Marital status: Married    Spouse name: Not on file  . Number of children: Not on file  . Years of education: Not on file  . Highest education level: Not on file  Occupational History  . Not on file  Social Needs  . Financial resource strain: Not on file  . Food insecurity:    Worry: Not on file    Inability: Not on file  . Transportation needs:    Medical: Not on file    Non-medical: Not on file  Tobacco Use  . Smoking status: Never Smoker  . Smokeless tobacco: Never Used  Substance and Sexual Activity  . Alcohol use: Yes  . Drug use: No  . Sexual activity: Yes  Lifestyle  . Physical activity:    Days per week: Not on file    Minutes per session: Not on file  . Stress: Not on file  Relationships  . Social connections:    Talks on phone: Not on file    Gets together: Not on file    Attends religious service: Not on file    Active member of club or organization: Not on file    Attends meetings of clubs or organizations: Not on file    Relationship status: Not on file  Other Topics  Concern  . Not on file  Social History Narrative  . Not on file   No Known Allergies No family history on file.   Current Outpatient Medications (Cardiovascular):  .  sildenafil (VIAGRA) 100 MG tablet, Take 100 mg by mouth daily as needed for erectile dysfunction.   Current Outpatient Medications (Analgesics):  .  allopurinol (ZYLOPRIM) 100 MG tablet, TAKE 1 TABLET BY MOUTH EVERY DAY   Current Outpatient Medications (Other):  .  busPIRone (BUSPAR) 10 MG tablet, Take 10 mg by mouth 3 (three) times daily as needed. .  clonazePAM (KLONOPIN) 0.5 MG tablet, Take 0.5 mg by mouth every 8 (eight) hours as needed for anxiety. .  hydrOXYzine (ATARAX/VISTARIL) 25 MG tablet, Take 25 mg by mouth 3 (three) times daily as needed.    Past medical history, social, surgical and family history all reviewed in electronic medical record.  No pertanent information unless stated regarding to the chief complaint.   Review of Systems:  No headache, visual changes, nausea, vomiting, diarrhea, constipation, dizziness, abdominal pain, skin rash, fevers, chills, night sweats, weight loss, swollen lymph nodes, body aches, joint swelling, muscle aches, chest pain, shortness of breath, mood  changes.   Objective  Blood pressure 100/80, pulse 65, height 5\' 8"  (1.727 m), weight 216 lb (98 kg), SpO2 97 %.   General: No apparent distress alert and oriented x3 mood and affect normal, dressed appropriately.  HEENT: Pupils equal, extraocular movements intact  Respiratory: Patient's speak in full sentences and does not appear short of breath  Cardiovascular: Trace lower extremity edema, non tender, no erythema  Skin: Warm dry intact with no signs of infection or rash on extremities or on axial skeleton.  Multitude of tattoos of the lower extremity Abdomen: Soft nontender  Neuro: Cranial nerves II through XII are intact, neurovascularly intact in all extremities with 2+ DTRs and 2+ pulses.  Lymph: No lymphadenopathy of  posterior or anterior cervical chain or axillae bilaterally.  Gait normal with good balance and coordination.  MSK:  Non tender with full range of motion and good stability and symmetric strength and tone of shoulders, elbows, wrist, hip, and ankles bilaterally.  Knee: Right knee Normal to inspection with no erythema or effusion or obvious bony abnormalities. Palpation normal with no warmth, joint line tenderness, patellar tenderness, or condyle tenderness. ROM full in flexion and extension and lower leg rotation. Ligaments with solid consistent endpoints including ACL, PCL, LCL, MCL. Negative Mcmurray's, Apley's, and Thessalonian tests. Non painful patellar compression. Patellar glide without crepitus. Patellar and quadriceps tendons unremarkable. Hamstring and quadriceps strength is normal.   Impression and Recommendations:     . The above documentation has been reviewed and is accurate and complete Corey SaaZachary M Harmoni Lucus, DO       Note: This dictation was prepared with Dragon dictation along with smaller phrase technology. Any transcriptional errors that result from this process are unintentional.

## 2018-09-19 NOTE — Assessment & Plan Note (Signed)
Patient does have a meniscal tear but doing very well with conservative therapy.  One steroid injection seem to be beneficial.  Discussed icing regimen and home exercise.  Discussed all anti-inflammatories as needed. This patient is well follow-up as needed

## 2019-01-05 ENCOUNTER — Other Ambulatory Visit: Payer: Self-pay | Admitting: Family Medicine

## 2019-01-24 DIAGNOSIS — F330 Major depressive disorder, recurrent, mild: Secondary | ICD-10-CM | POA: Diagnosis not present

## 2019-01-24 DIAGNOSIS — F4323 Adjustment disorder with mixed anxiety and depressed mood: Secondary | ICD-10-CM | POA: Diagnosis not present

## 2019-02-01 IMAGING — DX DG KNEE 1-2V*R*
2 series · 2 of 2 positions shown · non-contrast
Comparison: None.

CLINICAL DATA: Chronic right knee pain without known injury.

EXAM:
RIGHT KNEE - 1-2 VIEW

[knee ap (1 of 2)]
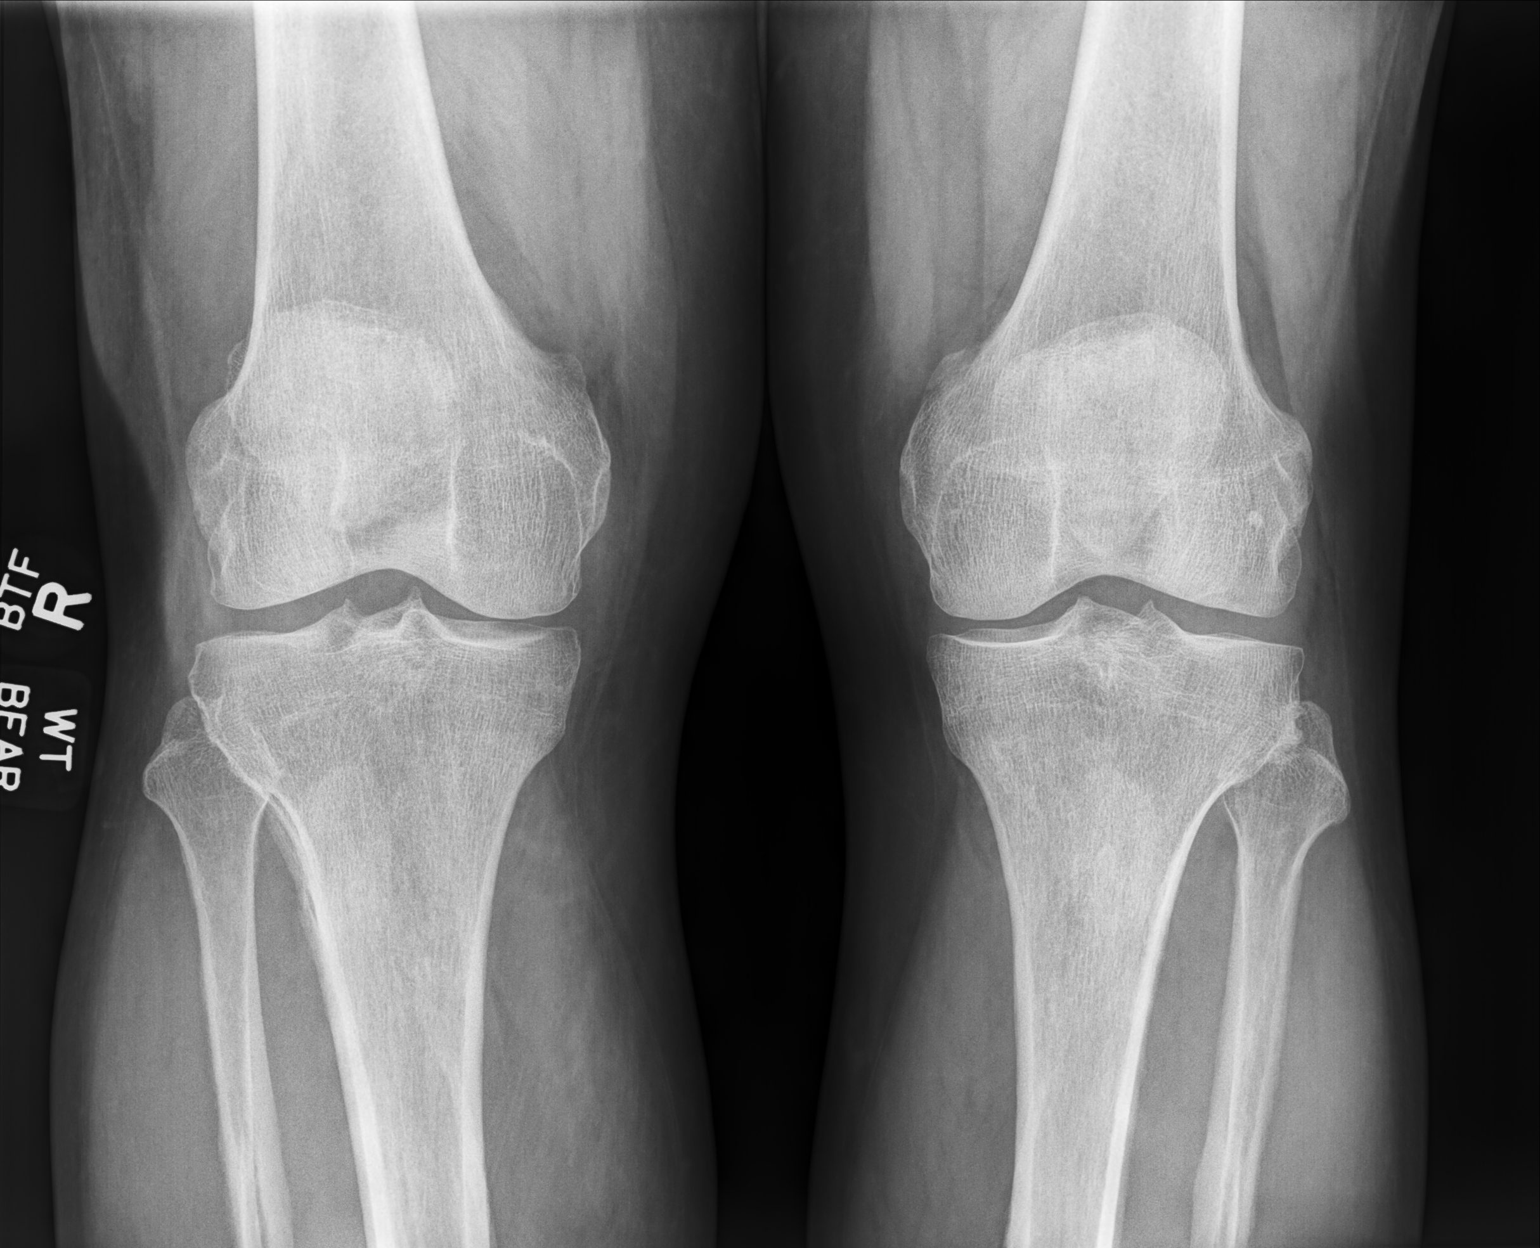

[knee ap (2 of 2)]
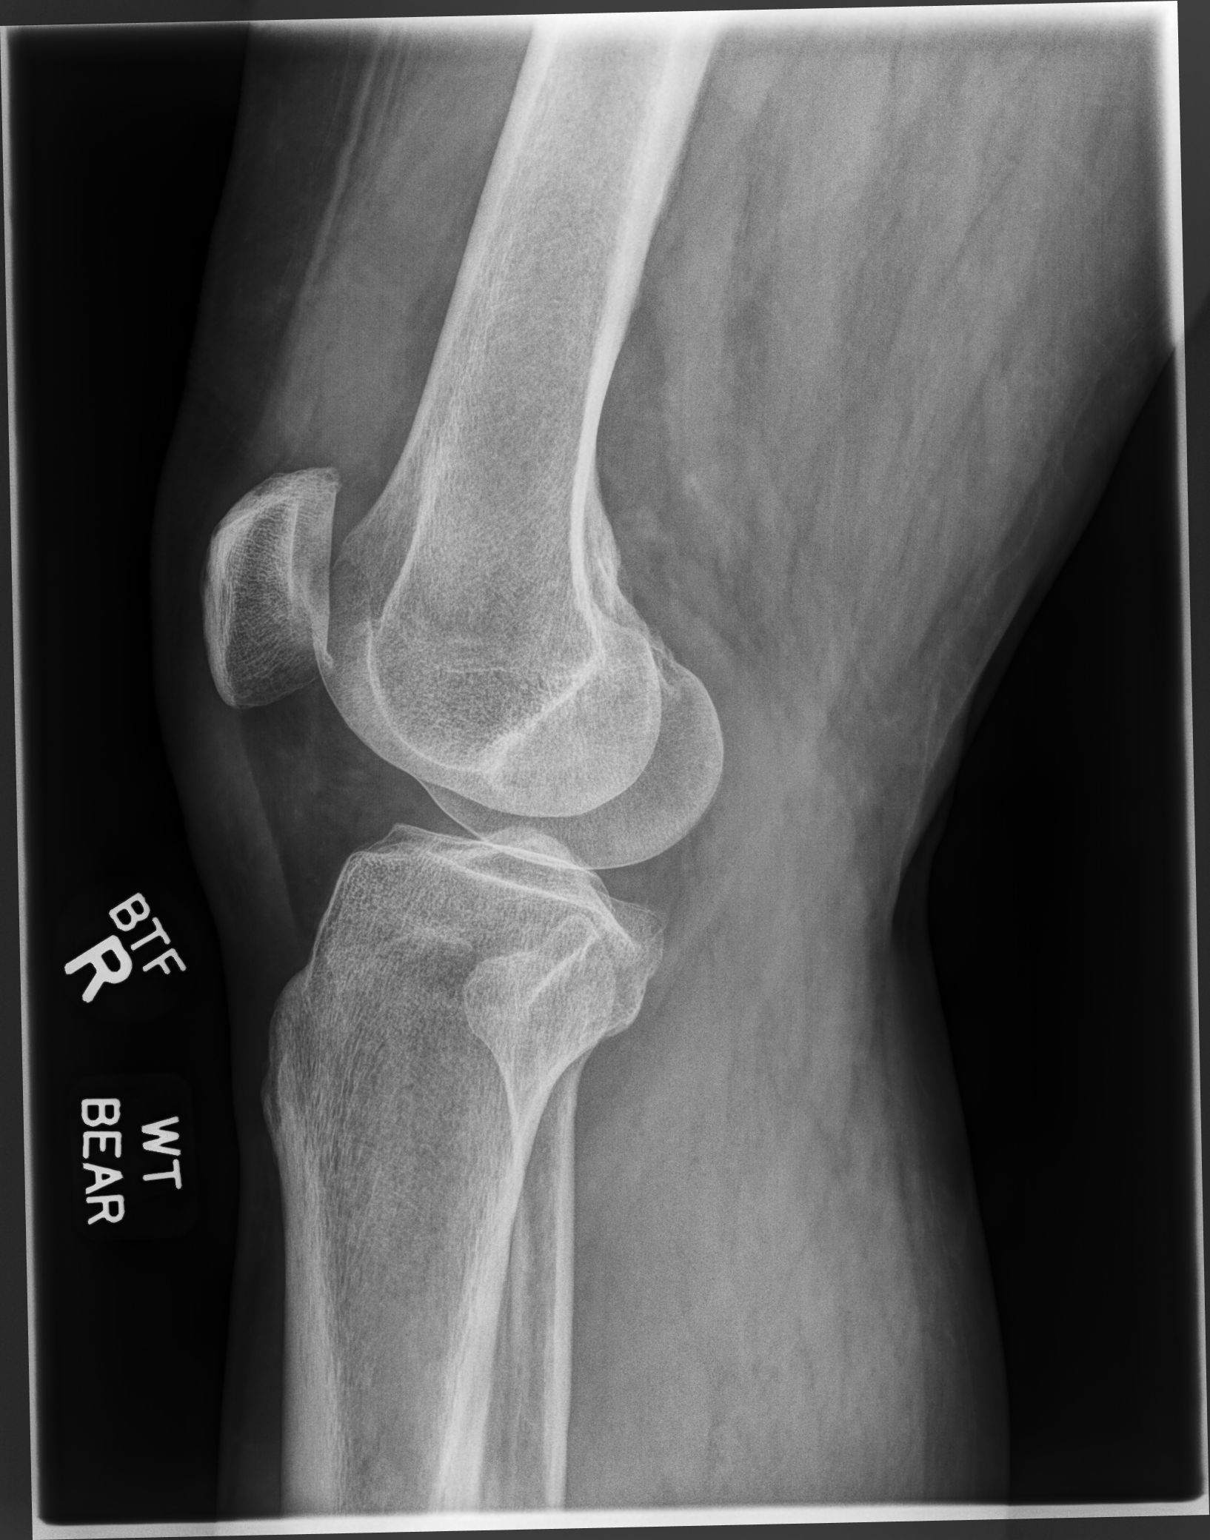

[2 of 2 positions shown; findings below may reference images not displayed]

FINDINGS: No evidence of fracture, dislocation, or joint effusion. No evidence
of arthropathy or other focal bone abnormality. Soft tissues are
unremarkable.
IMPRESSION: Negative.

## 2019-03-21 DIAGNOSIS — Z20828 Contact with and (suspected) exposure to other viral communicable diseases: Secondary | ICD-10-CM | POA: Diagnosis not present

## 2019-07-23 ENCOUNTER — Other Ambulatory Visit: Payer: Self-pay

## 2019-07-23 ENCOUNTER — Telehealth: Payer: Self-pay | Admitting: Family Medicine

## 2019-07-23 MED ORDER — ALLOPURINOL 100 MG PO TABS: 100.0000 mg | ORAL_TABLET | Freq: Every day | ORAL | 3 refills | Status: DC

## 2019-07-23 NOTE — Telephone Encounter (Signed)
Rx sent in

## 2019-07-23 NOTE — Telephone Encounter (Signed)
  LAST APPOINTMENT DATE: Visit date not found   NEXT APPOINTMENT DATE:@Visit  date not found  MEDICATION:allopurinol (ZYLOPRIM) 100 MG tablet-  PHARMACY:[id number] - [place], [place] - [address]  **Let patient know to contact pharmacy at the end of the day to make sure medication is ready. **  ** Please notify patient to allow 48-72 hours to process**  **Encourage patient to contact the pharmacy for refills or they can request refills through [facility]**  CLINICAL FILLS OUT ALL BELOW:   LAST REFILL:  QTY:  REFILL DATE:    OTHER COMMENTS:    Okay for refill?  Please advise

## 2019-07-25 DIAGNOSIS — F4323 Adjustment disorder with mixed anxiety and depressed mood: Secondary | ICD-10-CM | POA: Diagnosis not present

## 2019-07-25 DIAGNOSIS — F330 Major depressive disorder, recurrent, mild: Secondary | ICD-10-CM | POA: Diagnosis not present

## 2019-10-16 ENCOUNTER — Other Ambulatory Visit: Payer: Self-pay | Admitting: Family Medicine

## 2019-11-13 ENCOUNTER — Other Ambulatory Visit: Payer: Self-pay | Admitting: Family Medicine

## 2019-11-19 DIAGNOSIS — F330 Major depressive disorder, recurrent, mild: Secondary | ICD-10-CM | POA: Diagnosis not present

## 2019-11-19 DIAGNOSIS — F4323 Adjustment disorder with mixed anxiety and depressed mood: Secondary | ICD-10-CM | POA: Diagnosis not present

## 2019-12-14 ENCOUNTER — Other Ambulatory Visit: Payer: Self-pay | Admitting: Family Medicine

## 2019-12-18 ENCOUNTER — Other Ambulatory Visit: Payer: Self-pay | Admitting: Family Medicine

## 2019-12-19 ENCOUNTER — Telehealth: Payer: Self-pay | Admitting: Family Medicine

## 2019-12-19 MED ORDER — ALLOPURINOL 100 MG PO TABS: 100.0000 mg | ORAL_TABLET | Freq: Every day | ORAL | 0 refills | Status: DC

## 2019-12-19 NOTE — Telephone Encounter (Signed)
Medication sent in. 

## 2019-12-19 NOTE — Telephone Encounter (Signed)
  LAST APPOINTMENT DATE: 06/15/2019  NEXT APPOINTMENT DATE:@8 /16/2021  MEDICATION: allopurinol (ZYLOPRIM) 100 MG tablet  PHARMACY: [id number] - [place], [place] - [address]  Comments: Patient is going out of town for the next two weeks and is almost out

## 2019-12-30 ENCOUNTER — Encounter: Payer: Self-pay | Admitting: Family Medicine

## 2019-12-30 ENCOUNTER — Other Ambulatory Visit: Payer: Self-pay

## 2019-12-30 ENCOUNTER — Ambulatory Visit (INDEPENDENT_AMBULATORY_CARE_PROVIDER_SITE_OTHER): Payer: Federal, State, Local not specified - PPO | Admitting: Family Medicine

## 2019-12-30 VITALS — BP 90/57 | HR 67 | Temp 98.1°F | Ht 68.0 in | Wt 226.0 lb

## 2019-12-30 DIAGNOSIS — R739 Hyperglycemia, unspecified: Secondary | ICD-10-CM

## 2019-12-30 DIAGNOSIS — Z13220 Encounter for screening for lipoid disorders: Secondary | ICD-10-CM

## 2019-12-30 DIAGNOSIS — N529 Male erectile dysfunction, unspecified: Secondary | ICD-10-CM | POA: Insufficient documentation

## 2019-12-30 DIAGNOSIS — Z125 Encounter for screening for malignant neoplasm of prostate: Secondary | ICD-10-CM

## 2019-12-30 DIAGNOSIS — F411 Generalized anxiety disorder: Secondary | ICD-10-CM | POA: Diagnosis not present

## 2019-12-30 DIAGNOSIS — M109 Gout, unspecified: Secondary | ICD-10-CM | POA: Diagnosis not present

## 2019-12-30 DIAGNOSIS — Z0001 Encounter for general adult medical examination with abnormal findings: Secondary | ICD-10-CM | POA: Diagnosis not present

## 2019-12-30 DIAGNOSIS — Z6834 Body mass index (BMI) 34.0-34.9, adult: Secondary | ICD-10-CM

## 2019-12-30 DIAGNOSIS — E669 Obesity, unspecified: Secondary | ICD-10-CM

## 2019-12-30 MED ORDER — SILDENAFIL CITRATE 100 MG PO TABS: 100.0000 mg | ORAL_TABLET | Freq: Every day | ORAL | 5 refills | Status: DC | PRN

## 2019-12-30 MED ORDER — ALLOPURINOL 100 MG PO TABS: 100.0000 mg | ORAL_TABLET | Freq: Every day | ORAL | 3 refills | Status: DC

## 2019-12-30 NOTE — Assessment & Plan Note (Signed)
Stable.  Continue management per psychiatry. 

## 2019-12-30 NOTE — Assessment & Plan Note (Signed)
Stable.  Continue allopurinol 100 mg daily.  Check uric acid level today.

## 2019-12-30 NOTE — Patient Instructions (Signed)
It was very nice to see you today!  I will refill your medications.  We will check blood work today.  I will see you back in year for your next annual check up.  Please back to me sooner if needed.  Take care, Dr Jerline Pain  Please try these tips to maintain a healthy lifestyle:   Eat at least 3 REAL meals and 1-2 snacks per day.  Aim for no more than 5 hours between eating.  If you eat breakfast, please do so within one hour of getting up.    Each meal should contain half fruits/vegetables, one quarter protein, and one quarter carbs (no bigger than a computer mouse)   Cut down on sweet beverages. This includes juice, soda, and sweet tea.     Drink at least 1 glass of water with each meal and aim for at least 8 glasses per day   Exercise at least 150 minutes every week.    Preventive Care 56-35 Years Old, Male Preventive care refers to lifestyle choices and visits with your health care provider that can promote health and wellness. This includes:  A yearly physical exam. This is also called an annual well check.  Regular dental and eye exams.  Immunizations.  Screening for certain conditions.  Healthy lifestyle choices, such as eating a healthy diet, getting regular exercise, not using drugs or products that contain nicotine and tobacco, and limiting alcohol use. What can I expect for my preventive care visit? Physical exam Your health care provider will check:  Height and weight. These may be used to calculate body mass index (BMI), which is a measurement that tells if you are at a healthy weight.  Heart rate and blood pressure.  Your skin for abnormal spots. Counseling Your health care provider may ask you questions about:  Alcohol, tobacco, and drug use.  Emotional well-being.  Home and relationship well-being.  Sexual activity.  Eating habits.  Work and work Statistician. What immunizations do I need?  Influenza (flu) vaccine  This is recommended  every year. Tetanus, diphtheria, and pertussis (Tdap) vaccine  You may need a Td booster every 10 years. Varicella (chickenpox) vaccine  You may need this vaccine if you have not already been vaccinated. Zoster (shingles) vaccine  You may need this after age 36. Measles, mumps, and rubella (MMR) vaccine  You may need at least one dose of MMR if you were born in 1957 or later. You may also need a second dose. Pneumococcal conjugate (PCV13) vaccine  You may need this if you have certain conditions and were not previously vaccinated. Pneumococcal polysaccharide (PPSV23) vaccine  You may need one or two doses if you smoke cigarettes or if you have certain conditions. Meningococcal conjugate (MenACWY) vaccine  You may need this if you have certain conditions. Hepatitis A vaccine  You may need this if you have certain conditions or if you travel or work in places where you may be exposed to hepatitis A. Hepatitis B vaccine  You may need this if you have certain conditions or if you travel or work in places where you may be exposed to hepatitis B. Haemophilus influenzae type b (Hib) vaccine  You may need this if you have certain risk factors. Human papillomavirus (HPV) vaccine  If recommended by your health care provider, you may need three doses over 6 months. You may receive vaccines as individual doses or as more than one vaccine together in one shot (combination vaccines). Talk with your health  care provider about the risks and benefits of combination vaccines. What tests do I need? Blood tests  Lipid and cholesterol levels. These may be checked every 5 years, or more frequently if you are over 38 years old.  Hepatitis C test.  Hepatitis B test. Screening  Lung cancer screening. You may have this screening every year starting at age 42 if you have a 30-pack-year history of smoking and currently smoke or have quit within the past 15 years.  Prostate cancer screening.  Recommendations will vary depending on your family history and other risks.  Colorectal cancer screening. All adults should have this screening starting at age 78 and continuing until age 76. Your health care provider may recommend screening at age 20 if you are at increased risk. You will have tests every 1-10 years, depending on your results and the type of screening test.  Diabetes screening. This is done by checking your blood sugar (glucose) after you have not eaten for a while (fasting). You may have this done every 1-3 years.  Sexually transmitted disease (STD) testing. Follow these instructions at home: Eating and drinking  Eat a diet that includes fresh fruits and vegetables, whole grains, lean protein, and low-fat dairy products.  Take vitamin and mineral supplements as recommended by your health care provider.  Do not drink alcohol if your health care provider tells you not to drink.  If you drink alcohol: ? Limit how much you have to 0-2 drinks a day. ? Be aware of how much alcohol is in your drink. In the [place] one drink equals one 12 oz bottle of beer (355 mL), one 5 oz glass of wine (148 mL), or one 1 oz glass of hard liquor (44 mL). Lifestyle  Take daily care of your teeth and gums.  Stay active. Exercise for at least 30 minutes on 5 or more days each week.  Do not use any products that contain nicotine or tobacco, such as cigarettes, e-cigarettes, and chewing tobacco. If you need help quitting, ask your health care provider.  If you are sexually active, practice safe sex. Use a condom or other form of protection to prevent STIs (sexually transmitted infections).  Talk with your health care provider about taking a low-dose aspirin every day starting at age 32. What's next?  Go to your health care provider once a year for a well check visit.  Ask your health care provider how often you should have your eyes and teeth checked.  Stay up to date on all vaccines. This  information is not intended to replace advice given to you by your health care provider. Make sure you discuss any questions you have with your health care provider. Document Revised: 04/26/2018 Document Reviewed: 04/26/2018 Elsevier Patient Education  2020 [employer].

## 2019-12-30 NOTE — Assessment & Plan Note (Signed)
Stable. Will send in viagra 100mg  daily as needed.

## 2019-12-30 NOTE — Assessment & Plan Note (Signed)
Check A1c. 

## 2019-12-30 NOTE — Progress Notes (Signed)
Chief Complaint:  Corey [name] is a 64 y.o. male who presents today for his annual comprehensive physical exam.    Assessment/Plan:  New Issues: Right Leg Stiffness No red flags on exam.  Possibly mild quad strain.  Symptoms are improving with home stretches.  He will continue this and let us know if not improving.  Chronic Problems Addressed Today: Erectile dysfunction Stable. Will send in viagra 100mg  daily as needed.   GAD (generalized anxiety disorder) Stable.  Continue management per psychiatry.  Gout Stable.  Continue allopurinol 100 mg daily.  Check uric acid level today.  Hyperglycemia Check A1c.  Body mass index is 34.36 kg/m. / Obese  BMI Metric Follow Up - 12/30/19 0939      BMI Metric Follow Up-Please document annually   BMI Metric Follow Up Education provided          Preventative Healthcare: Up-to-date on colon cancer screening.  Due for next Cologuard in 2023.  Check CBC, CMET, TSH, lipid panel, PSA.  Patient Counseling(The following topics were reviewed and/or handout was given):  -Nutrition: Stressed importance of moderation in sodium/caffeine intake, saturated fat and cholesterol, caloric balance, sufficient intake of fresh fruits, vegetables, and fiber.  -Stressed the importance of regular exercise.   -Substance Abuse: Discussed cessation/primary prevention of tobacco, alcohol, or other drug use; driving or other dangerous activities under the influence; availability of treatment for abuse.   -Injury prevention: Discussed safety belts, safety helmets, smoke detector, smoking near bedding or upholstery.   -Sexuality: Discussed sexually transmitted diseases, partner selection, use of condoms, avoidance of unintended pregnancy and contraceptive alternatives.   -Dental health: Discussed importance of regular tooth brushing, flossing, and dental visits.  -Health maintenance and immunizations reviewed. Please refer to Health maintenance  section.  Return to care in 1 year for next preventative visit.     Subjective:  HPI:  He has no acute complaints today.   Lifestyle Diet: None.  Exercise: None.   Depression screen PHQ 2/9 06/14/2018  Decreased Interest 0  Down, Depressed, Hopeless 0  PHQ - 2 Score 0   There are no preventive care reminders to display for this patient.  ROS: Per HPI, otherwise a complete review of systems was negative.   PMH:  The following were reviewed and entered/updated in epic: Past Medical History:  Diagnosis Date  . Depression   . Gout    Patient Active Problem List   Diagnosis Date Noted  . Gout 12/30/2019  . GAD (generalized anxiety disorder) 12/30/2019  . Erectile dysfunction 12/30/2019  . Hyperglycemia 12/30/2019  . Right knee meniscal tear 09/19/2018   History reviewed. No pertinent surgical history.  Family History  Adopted: Yes  Family history unknown: Yes    Medications- reviewed and updated Current Outpatient Medications  Medication Sig Dispense Refill  . allopurinol (ZYLOPRIM) 100 MG tablet Take 1 tablet (100 mg total) by mouth daily. 90 tablet 3  . busPIRone (BUSPAR) 10 MG tablet Take 10 mg by mouth 3 (three) times daily as needed.    . clonazePAM (KLONOPIN) 0.5 MG tablet Take 0.5 mg by mouth every 8 (eight) hours as needed for anxiety.    . hydrOXYzine (ATARAX/VISTARIL) 25 MG tablet Take 25 mg by mouth 3 (three) times daily as needed.    . sildenafil (VIAGRA) 100 MG tablet Take 1 tablet (100 mg total) by mouth daily as needed for erectile dysfunction. 30 tablet 5   No current facility-administered medications for this visit.    Allergies-reviewed  and updated No Known Allergies  Social History   Socioeconomic History  . Marital status: Married    Spouse name: Not on file  . Number of children: Not on file  . Years of education: Not on file  . Highest education level: Not on file  Occupational History  . Not on file  Tobacco Use  . Smoking  status: Never Smoker  . Smokeless tobacco: Never Used  Substance and Sexual Activity  . Alcohol use: Yes  . Drug use: No  . Sexual activity: Yes  Other Topics Concern  . Not on file  Social History Narrative  . Not on file   Social Determinants of Health   Financial Resource Strain:   . Difficulty of Paying Living Expenses:   Food Insecurity:   . Worried About Programme researcher, broadcasting/film/video in the Last Year:   . Barista in the Last Year:   Transportation Needs:   . Freight forwarder (Medical):   Marland Kitchen Lack of Transportation (Non-Medical):   Physical Activity:   . Days of Exercise per Week:   . Minutes of Exercise per Session:   Stress:   . Feeling of Stress :   Social Connections:   . Frequency of Communication with Friends and Family:   . Frequency of Social Gatherings with Friends and Family:   . Attends Religious Services:   . Active Member of Clubs or Organizations:   . Attends Banker Meetings:   Marland Kitchen Marital Status:         Objective:  Physical Exam: BP (!) 90/57   Pulse 67   Temp 98.1 F (36.7 C)   Ht 5\' 8"  (1.727 m)   Wt 226 lb (102.5 kg)   SpO2 97%   BMI 34.36 kg/m   Body mass index is 34.36 kg/m. Wt Readings from Last 3 Encounters:  12/30/19 226 lb (102.5 kg)  09/19/18 216 lb (98 kg)  07/16/18 212 lb 3.2 oz (96.3 kg)   Gen: NAD, resting comfortably HEENT: TMs normal bilaterally. OP clear. No thyromegaly noted.  CV: RRR with no murmurs appreciated Pulm: NWOB, CTAB with no crackles, wheezes, or rhonchi GI: Normal bowel sounds present. Soft, Nontender, Nondistended. MSK: no edema, cyanosis, or clubbing noted Skin: warm, dry Neuro: CN2-12 grossly intact. Strength 5/5 in upper and lower extremities. Reflexes symmetric and intact bilaterally.  Psych: Normal affect and thought content     Allan Bacigalupi M. 09/15/18, MD 12/30/2019 9:39 AM

## 2019-12-31 LAB — COMPREHENSIVE METABOLIC PANEL
AG Ratio: 1.7 (calc) (ref 1.0–2.5)
ALT: 29 U/L (ref 9–46)
AST: 19 U/L (ref 10–35)
Albumin: 4.4 g/dL (ref 3.6–5.1)
Alkaline phosphatase (APISO): 82 U/L (ref 35–144)
BUN: 12 mg/dL (ref 7–25)
CO2: 28 mmol/L (ref 20–32)
Calcium: 9.2 mg/dL (ref 8.6–10.3)
Chloride: 105 mmol/L (ref 98–110)
Creat: 0.8 mg/dL (ref 0.70–1.25)
Globulin: 2.6 g/dL (calc) (ref 1.9–3.7)
Glucose, Bld: 86 mg/dL (ref 65–99)
Potassium: 4.5 mmol/L (ref 3.5–5.3)
Sodium: 139 mmol/L (ref 135–146)
Total Bilirubin: 0.7 mg/dL (ref 0.2–1.2)
Total Protein: 7 g/dL (ref 6.1–8.1)

## 2019-12-31 LAB — CBC
HCT: 46.1 % (ref 38.5–50.0)
Hemoglobin: 15.7 g/dL (ref 13.2–17.1)
MCH: 32.3 pg (ref 27.0–33.0)
MCHC: 34.1 g/dL (ref 32.0–36.0)
MCV: 94.9 fL (ref 80.0–100.0)
MPV: 9.4 fL (ref 7.5–12.5)
Platelets: 261 10*3/uL (ref 140–400)
RBC: 4.86 10*6/uL (ref 4.20–5.80)
RDW: 12.6 % (ref 11.0–15.0)
WBC: 6.5 10*3/uL (ref 3.8–10.8)

## 2019-12-31 LAB — LIPID PANEL
Cholesterol: 183 mg/dL (ref <200)
HDL: 57 mg/dL (ref >=40)
LDL Cholesterol (Calc): 101 mg/dL (calc) — ABNORMAL HIGH
Non-HDL Cholesterol (Calc): 126 mg/dL (calc) (ref <130)
Total CHOL/HDL Ratio: 3.2 (calc) (ref <5.0)
Triglycerides: 158 mg/dL — ABNORMAL HIGH (ref <150)

## 2019-12-31 LAB — HEMOGLOBIN A1C
Hgb A1c MFr Bld: 5.7 % of total Hgb — ABNORMAL HIGH (ref <5.7)
Mean Plasma Glucose: 117 (calc)
eAG (mmol/L): 6.5 (calc)

## 2019-12-31 LAB — URIC ACID: Uric Acid, Serum: 7.1 mg/dL (ref 4.0–8.0)

## 2019-12-31 LAB — PSA: PSA: 1.3 ng/mL (ref <=4.0)

## 2019-12-31 LAB — TSH: TSH: 2.14 mIU/L (ref 0.40–4.50)

## 2020-01-01 NOTE — Progress Notes (Signed)
Please inform patient of the following:  Blood work is all STABLE. Would like for him to keep up the good work. Do not need to make any changes to his treatment plan at this time. We can recheck again in 1 year.  Katina Degree. Jimmey Ralph, MD 01/01/2020 11:47 AM

## 2020-02-11 DIAGNOSIS — Z20822 Contact with and (suspected) exposure to covid-19: Secondary | ICD-10-CM | POA: Diagnosis not present

## 2020-05-27 DIAGNOSIS — Z20822 Contact with and (suspected) exposure to covid-19: Secondary | ICD-10-CM | POA: Diagnosis not present

## 2020-05-27 DIAGNOSIS — Z03818 Encounter for observation for suspected exposure to other biological agents ruled out: Secondary | ICD-10-CM | POA: Diagnosis not present

## 2020-07-09 ENCOUNTER — Other Ambulatory Visit: Payer: Self-pay

## 2020-07-09 ENCOUNTER — Ambulatory Visit: Payer: Federal, State, Local not specified - PPO | Admitting: Family Medicine

## 2020-07-09 ENCOUNTER — Encounter: Payer: Self-pay | Admitting: Family Medicine

## 2020-07-09 VITALS — BP 104/69 | HR 69 | Temp 97.7°F | Ht 68.0 in | Wt 231.4 lb

## 2020-07-09 DIAGNOSIS — R21 Rash and other nonspecific skin eruption: Secondary | ICD-10-CM | POA: Diagnosis not present

## 2020-07-09 MED ORDER — IVERMECTIN 3 MG PO TABS: 200.0000 ug/kg | ORAL_TABLET | Freq: Once | ORAL | 1 refills | Status: AC

## 2020-07-09 NOTE — Patient Instructions (Signed)
It was very nice to see you today!  I will send in ivermectin.  Please let me know if your symptoms do not improve.  You can repeat the dose in 1 to 2 weeks if needed.  Take care, Dr Jimmey Ralph  Please try these tips to maintain a healthy lifestyle:   Eat at least 3 REAL meals and 1-2 snacks per day.  Aim for no more than 5 hours between eating.  If you eat breakfast, please do so within one hour of getting up.    Each meal should contain half fruits/vegetables, one quarter protein, and one quarter carbs (no bigger than a computer mouse)   Cut down on sweet beverages. This includes juice, soda, and sweet tea.     Drink at least 1 glass of water with each meal and aim for at least 8 glasses per day   Exercise at least 150 minutes every week.

## 2020-07-09 NOTE — Progress Notes (Signed)
   Corey [name] is a 65 y.o. male who presents today for an office visit.  Assessment/Plan:  Rash Concern for possible scabies given known contact.  He does not want to use permethrin.  Will give one-time dose of ivermectin.  He will let me know if symptoms or not improving.  He can continue over-the-counter meds as needed.    Subjective:  HPI:  Patient here with concern for parasitic infection in his foot. Recently went to [place] four weeks ago.  Partner has had similar symptoms.  Has had very pruritic rash on his right foot.  Partner was recently diagnosed with scabies and started on permethrin.  He has tried over-the-counter meds with some improvement.       Objective:  Physical Exam: BP 104/69   Pulse 69   Temp 97.7 F (36.5 C) (Temporal)   Ht 5\' 8"  (1.727 m)   Wt 231 lb 6.4 oz (105 kg)   SpO2 96%   BMI 35.18 kg/m   Gen: No acute distress, resting comfortably Skin: Erythematous excoriated rash on left foot.  Some interdigital crusting noted but this is at baseline per patient. Psych: Normal affect and thought content      Raegen Tarpley M. , MD 07/09/2020 11:13 AM

## 2020-12-21 ENCOUNTER — Other Ambulatory Visit: Payer: Self-pay | Admitting: Family Medicine

## 2020-12-30 ENCOUNTER — Other Ambulatory Visit: Payer: Self-pay

## 2020-12-30 ENCOUNTER — Ambulatory Visit (INDEPENDENT_AMBULATORY_CARE_PROVIDER_SITE_OTHER): Payer: Federal, State, Local not specified - PPO | Admitting: Family Medicine

## 2020-12-30 ENCOUNTER — Encounter: Payer: Self-pay | Admitting: Family Medicine

## 2020-12-30 VITALS — BP 114/73 | HR 56 | Temp 97.8°F | Ht 68.0 in | Wt 227.0 lb

## 2020-12-30 DIAGNOSIS — B070 Plantar wart: Secondary | ICD-10-CM

## 2020-12-30 DIAGNOSIS — M109 Gout, unspecified: Secondary | ICD-10-CM

## 2020-12-30 DIAGNOSIS — N529 Male erectile dysfunction, unspecified: Secondary | ICD-10-CM

## 2020-12-30 DIAGNOSIS — Z125 Encounter for screening for malignant neoplasm of prostate: Secondary | ICD-10-CM

## 2020-12-30 DIAGNOSIS — E785 Hyperlipidemia, unspecified: Secondary | ICD-10-CM | POA: Diagnosis not present

## 2020-12-30 DIAGNOSIS — Z0001 Encounter for general adult medical examination with abnormal findings: Secondary | ICD-10-CM

## 2020-12-30 DIAGNOSIS — R739 Hyperglycemia, unspecified: Secondary | ICD-10-CM

## 2020-12-30 DIAGNOSIS — F411 Generalized anxiety disorder: Secondary | ICD-10-CM

## 2020-12-30 LAB — LIPID PANEL
Cholesterol: 144 mg/dL (ref 0–200)
HDL: 48.7 mg/dL (ref >39.00)
LDL Cholesterol: 79 mg/dL (ref 0–99)
NonHDL: 95.43
Total CHOL/HDL Ratio: 3
Triglycerides: 80 mg/dL (ref 0.0–149.0)
VLDL: 16 mg/dL (ref 0.0–40.0)

## 2020-12-30 LAB — COMPREHENSIVE METABOLIC PANEL
ALT: 33 U/L (ref 0–53)
AST: 20 U/L (ref 0–37)
Albumin: 4.2 g/dL (ref 3.5–5.2)
Alkaline Phosphatase: 70 U/L (ref 39–117)
BUN: 6 mg/dL (ref 6–23)
CO2: 27 mEq/L (ref 19–32)
Calcium: 9.2 mg/dL (ref 8.4–10.5)
Chloride: 107 mEq/L (ref 96–112)
Creatinine, Ser: 0.85 mg/dL (ref 0.40–1.50)
GFR: 91.37 mL/min (ref >60.00)
Glucose, Bld: 86 mg/dL (ref 70–99)
Potassium: 4.4 mEq/L (ref 3.5–5.1)
Sodium: 141 mEq/L (ref 135–145)
Total Bilirubin: 0.5 mg/dL (ref 0.2–1.2)
Total Protein: 6.7 g/dL (ref 6.0–8.3)

## 2020-12-30 LAB — CBC
HCT: 45.6 % (ref 39.0–52.0)
Hemoglobin: 15.2 g/dL (ref 13.0–17.0)
MCHC: 33.4 g/dL (ref 30.0–36.0)
MCV: 95.3 fl (ref 78.0–100.0)
Platelets: 269 10*3/uL (ref 150.0–400.0)
RBC: 4.79 Mil/uL (ref 4.22–5.81)
RDW: 13.5 % (ref 11.5–15.5)
WBC: 4.9 10*3/uL (ref 4.0–10.5)

## 2020-12-30 LAB — TSH: TSH: 1.27 u[IU]/mL (ref 0.35–5.50)

## 2020-12-30 LAB — PSA: PSA: 0.87 ng/mL (ref 0.10–4.00)

## 2020-12-30 LAB — URIC ACID: Uric Acid, Serum: 5.6 mg/dL (ref 4.0–7.8)

## 2020-12-30 LAB — HEMOGLOBIN A1C: Hgb A1c MFr Bld: 6 % (ref 4.6–6.5)

## 2020-12-30 MED ORDER — SILDENAFIL CITRATE 100 MG PO TABS: 100.0000 mg | ORAL_TABLET | Freq: Every day | ORAL | 5 refills | Status: DC | PRN

## 2020-12-30 NOTE — Patient Instructions (Signed)
It was very nice to see you today!  We froze your wart today.  Please let us know if this needs to be frozen again  We will check blood work today.  I will see back in a year.  Come back to see me sooner if needed.  Take care, Dr Jimmey Ralph  PLEASE NOTE:  If you had any lab tests please let us know if you have not heard back within a few days. You may see your results on mychart before we have a chance to review them but we will give you a call once they are reviewed by [place]. If we ordered any referrals today, please let us know if you have not heard from their office within the next week.   Please try these tips to maintain a healthy lifestyle:  Eat at least 3 REAL meals and 1-2 snacks per day.  Aim for no more than 5 hours between eating.  If you eat breakfast, please do so within one hour of getting up.   Each meal should contain half fruits/vegetables, one quarter protein, and one quarter carbs (no bigger than a computer mouse)  Cut down on sweet beverages. This includes juice, soda, and sweet tea.   Drink at least 1 glass of water with each meal and aim for at least 8 glasses per day  Exercise at least 150 minutes every week.    Preventive Care 71 Years and Older, Male Preventive care refers to lifestyle choices and visits with your health care provider that can promote health and wellness. This includes: A yearly physical exam. This is also called an annual wellness visit. Regular dental and eye exams. Immunizations. Screening for certain conditions. Healthy lifestyle choices, such as: Eating a healthy diet. Getting regular exercise. Not using drugs or products that contain nicotine and tobacco. Limiting alcohol use. What can I expect for my preventive care visit? Physical exam Your health care provider will check your: Height and weight. These may be used to calculate your BMI (body mass index). BMI is a measurement that tells if you are at a healthy weight. Heart rate and  blood pressure. Body temperature. Skin for abnormal spots. Counseling Your health care provider may ask you questions about your: Past medical problems. Family's medical history. Alcohol, tobacco, and drug use. Emotional well-being. Home life and relationship well-being. Sexual activity. Diet, exercise, and sleep habits. History of falls. Memory and ability to understand (cognition). Work and work Astronomer. Access to firearms. What immunizations do I need?  Vaccines are usually given at various ages, according to a schedule. Your health care provider will recommend vaccines for you based on your age, medicalhistory, and lifestyle or other factors, such as travel or where you work. What tests do I need? Blood tests Lipid and cholesterol levels. These may be checked every 5 years, or more often depending on your overall health. Hepatitis C test. Hepatitis B test. Screening Lung cancer screening. You may have this screening every year starting at age 33 if you have a 30-pack-year history of smoking and currently smoke or have quit within the past 15 years. Colorectal cancer screening. All adults should have this screening starting at age 65 and continuing until age 84. Your health care provider may recommend screening at age 58 if you are at increased risk. You will have tests every 1-10 years, depending on your results and the type of screening test. Prostate cancer screening. Recommendations will vary depending on your family history and other risks.  Genital exam to check for testicular cancer or hernias. Diabetes screening. This is done by checking your blood sugar (glucose) after you have not eaten for a while (fasting). You may have this done every 1-3 years. Abdominal aortic aneurysm (AAA) screening. You may need this if you are a current or former smoker. STD (sexually transmitted disease) testing, if you are at risk. Follow these instructions at home: Eating and  drinking  Eat a diet that includes fresh fruits and vegetables, whole grains, lean protein, and low-fat dairy products. Limit your intake of foods with high amounts of sugar, saturated fats, and salt. Take vitamin and mineral supplements as recommended by your health care provider. Do not drink alcohol if your health care provider tells you not to drink. If you drink alcohol: Limit how much you have to 0-2 drinks a day. Be aware of how much alcohol is in your drink. In the [place] one drink equals one 12 oz bottle of beer (355 mL), one 5 oz glass of wine (148 mL), or one 1 oz glass of hard liquor (44 mL).  Lifestyle Take daily care of your teeth and gums. Brush your teeth every morning and night with fluoride toothpaste. Floss one time each day. Stay active. Exercise for at least 30 minutes 5 or more days each week. Do not use any products that contain nicotine or tobacco, such as cigarettes, e-cigarettes, and chewing tobacco. If you need help quitting, ask your health care provider. Do not use drugs. If you are sexually active, practice safe sex. Use a condom or other form of protection to prevent STIs (sexually transmitted infections). Talk with your health care provider about taking a low-dose aspirin or statin. Find healthy ways to cope with stress, such as: Meditation, yoga, or listening to music. Journaling. Talking to a trusted person. Spending time with friends and family. Safety Always wear your seat belt while driving or riding in a vehicle. Do not drive: If you have been drinking alcohol. Do not ride with someone who has been drinking. When you are tired or distracted. While texting. Wear a helmet and other protective equipment during sports activities. If you have firearms in your house, make sure you follow all gun safety procedures. What's next? Visit your health care provider once a year for an annual wellness visit. Ask your health care provider how often you should have  your eyes and teeth checked. Stay up to date on all vaccines. This information is not intended to replace advice given to you by your health care provider. Make sure you discuss any questions you have with your healthcare provider. Document Revised: 01/29/2019 Document Reviewed: 04/26/2018 Elsevier Patient Education  2022 [employer].

## 2020-12-30 NOTE — Assessment & Plan Note (Signed)
On allopurinol 100 mg daily.  Check uric acid.  He has made dietary modifications which is helped.

## 2020-12-30 NOTE — Progress Notes (Signed)
Chief Complaint:  Corey [name] is a 65 y.o. male who presents today for his annual comprehensive physical exam.    Assessment/Plan:  New/Acute Problems: Plantar wart Cryotherapy applied today.  See below procedure note.  He tolerated well.  Chronic Problems Addressed Today: No problem-specific Assessment & Plan notes found for this encounter.   There is no height or weight on file to calculate BMI. / Obese    Preventative Healthcare: Check Labs. UTD on colon cancer screening.   Patient Counseling(The following topics were reviewed and/or handout was given):  -Nutrition: Stressed importance of moderation in sodium/caffeine intake, saturated fat and cholesterol, caloric balance, sufficient intake of fresh fruits, vegetables, and fiber.  -Stressed the importance of regular exercise.   -Substance Abuse: Discussed cessation/primary prevention of tobacco, alcohol, or other drug use; driving or other dangerous activities under the influence; availability of treatment for abuse.   -Injury prevention: Discussed safety belts, safety helmets, smoke detector, smoking near bedding or upholstery.   -Sexuality: Discussed sexually transmitted diseases, partner selection, use of condoms, avoidance of unintended pregnancy and contraceptive alternatives.   -Dental health: Discussed importance of regular tooth brushing, flossing, and dental visits.  -Health maintenance and immunizations reviewed. Please refer to Health maintenance section.  Return to care in 1 year for next preventative visit.     Subjective:  HPI:  He has no acute complaints today.   Towards the end of June he believes he stepped on something and has it has the feeling of a bruise but does not have the appearance of one. It is painful to walk barefoot on hard floor.   He denies having recent issues with his gout. Lifestyle Diet: Reasonably healthy diet, cut down on alcohol consumption Exercise: Walking, can improve  exercise regimen   Depression screen [facility] 2/9 07/09/2020  Decreased Interest 0  Down, Depressed, Hopeless 0  PHQ - 2 Score 0    Health Maintenance Due  Topic Date Due   COVID-19 Vaccine (3 - Booster for Pfizer series) 01/19/2020   PNA vac Low Risk Adult (1 of 2 - PCV13) Never done   INFLUENZA VACCINE  12/14/2020     ROS: Per HPI, otherwise a complete review of systems was negative.   PMH:  The following were reviewed and entered/updated in epic: Past Medical History:  Diagnosis Date   Depression    Gout    Patient Active Problem List   Diagnosis Date Noted   Gout 12/30/2019   GAD (generalized anxiety disorder) 12/30/2019   Erectile dysfunction 12/30/2019   Hyperglycemia 12/30/2019   Right knee meniscal tear 09/19/2018   No past surgical history on file.  Family History  Adopted: Yes  Family history unknown: Yes    Medications- reviewed and updated Current Outpatient Medications  Medication Sig Dispense Refill   allopurinol (ZYLOPRIM) 100 MG tablet TAKE 1 TABLET BY MOUTH EVERY DAY 90 tablet 3   busPIRone (BUSPAR) 10 MG tablet Take 10 mg by mouth 3 (three) times daily as needed.     clonazePAM (KLONOPIN) 0.5 MG tablet Take 0.5 mg by mouth every 8 (eight) hours as needed for anxiety.     hydrOXYzine (ATARAX/VISTARIL) 25 MG tablet Take 25 mg by mouth 3 (three) times daily as needed.     sildenafil (VIAGRA) 100 MG tablet Take 1 tablet (100 mg total) by mouth daily as needed for erectile dysfunction. 30 tablet 5   No current facility-administered medications for this visit.    Allergies-reviewed and updated No  Known Allergies  Social History   Socioeconomic History   Marital status: Married    Spouse name: Not on file   Number of children: Not on file   Years of education: Not on file   Highest education level: Not on file  Occupational History   Not on file  Tobacco Use   Smoking status: Never   Smokeless tobacco: Never  Substance and Sexual Activity    Alcohol use: Yes   Drug use: No   Sexual activity: Yes  Other Topics Concern   Not on file  Social History Narrative   Not on file   Social Determinants of Health   Financial Resource Strain: Not on file  Food Insecurity: Not on file  Transportation Needs: Not on file  Physical Activity: Not on file  Stress: Not on file  Social Connections: Not on file        Objective:  Physical Exam: There were no vitals taken for this visit.  There is no height or weight on file to calculate BMI. Wt Readings from Last 3 Encounters:  07/09/20 231 lb 6.4 oz (105 kg)  12/30/19 226 lb (102.5 kg)  09/19/18 216 lb (98 kg)   Gen: NAD, resting comfortably HEENT: TMs normal bilaterally. OP clear. No thyromegaly noted.  CV: RRR with no murmurs appreciated Pulm: NWOB, CTAB with no crackles, wheezes, or rhonchi GI: Normal bowel sounds present. Soft, Nontender, Nondistended. MSK: no edema, cyanosis, or clubbing noted Skin: warm, dry.  Plantar wart on right foot. Neuro: CN2-12 grossly intact. Strength 5/5 in upper and lower extremities. Reflexes symmetric and intact bilaterally.  Psych: Normal affect and thought content  Cryotherapy Procedure Note  Pre-operative Diagnosis: Plantar wart  Locations: Right foot  Indications: Therapeutic  Procedure Details  Patient informed of risks (permanent scarring, infection, light or dark discoloration, bleeding, infection, weakness, numbness and recurrence of the lesion) and benefits of the procedure and verbal informed consent obtained.  The areas are treated with liquid nitrogen therapy, frozen until ice ball extended 3 mm beyond lesion and curetted.  The lesion was then allowed to thaw, and treated again.  A total of 3 cycles of cryotherapy and curettage were performed.  The patient tolerated procedure well.  The patient was instructed on post-op care, warned that there may be blister formation, redness and pain. Recommend OTC analgesia as needed for  pain.  Condition: Stable  Complications: none.      I,Jordan Kelly,acting as a Neurosurgeon for Jacquiline Doe, MD.,have documented all relevant documentation on the behalf of Jacquiline Doe, MD,as directed by  Jacquiline Doe, MD while in the presence of Jacquiline Doe, MD.  I, Jacquiline Doe, MD, have reviewed all documentation for this visit. The documentation on 12/30/20 for the exam, diagnosis, procedures, and orders are all accurate and complete.  Katina Degree. Jimmey Ralph, MD 12/30/2020 7:50 AM

## 2020-12-30 NOTE — Assessment & Plan Note (Signed)
Check A1c. 

## 2020-12-30 NOTE — Assessment & Plan Note (Signed)
Check labs 

## 2020-12-30 NOTE — Assessment & Plan Note (Signed)
Refill Viagra.  Tolerating well with minimal side effects.

## 2020-12-30 NOTE — Assessment & Plan Note (Signed)
Continue medications per psychiatry

## 2021-01-01 NOTE — Progress Notes (Signed)
Please inform patient of the following:  A1c is up slightly but all his other labs are stable.  Do not need to make any adjustments to treatment plan at this time.  He should continue working on diet and exercise and we can recheck in a year.

## 2021-12-18 ENCOUNTER — Other Ambulatory Visit: Payer: Self-pay | Admitting: Family Medicine

## 2022-01-18 ENCOUNTER — Telehealth: Payer: Self-pay | Admitting: Family Medicine

## 2022-01-18 ENCOUNTER — Ambulatory Visit (INDEPENDENT_AMBULATORY_CARE_PROVIDER_SITE_OTHER): Payer: Federal, State, Local not specified - PPO | Admitting: Family Medicine

## 2022-01-18 ENCOUNTER — Encounter: Payer: Self-pay | Admitting: Family Medicine

## 2022-01-18 VITALS — BP 124/72 | HR 66 | Temp 97.5°F | Ht 68.0 in | Wt 236.4 lb

## 2022-01-18 DIAGNOSIS — Z23 Encounter for immunization: Secondary | ICD-10-CM | POA: Diagnosis not present

## 2022-01-18 DIAGNOSIS — Z1211 Encounter for screening for malignant neoplasm of colon: Secondary | ICD-10-CM

## 2022-01-18 DIAGNOSIS — F411 Generalized anxiety disorder: Secondary | ICD-10-CM | POA: Diagnosis not present

## 2022-01-18 DIAGNOSIS — R739 Hyperglycemia, unspecified: Secondary | ICD-10-CM | POA: Diagnosis not present

## 2022-01-18 DIAGNOSIS — Z0001 Encounter for general adult medical examination with abnormal findings: Secondary | ICD-10-CM

## 2022-01-18 DIAGNOSIS — E785 Hyperlipidemia, unspecified: Secondary | ICD-10-CM

## 2022-01-18 DIAGNOSIS — M109 Gout, unspecified: Secondary | ICD-10-CM

## 2022-01-18 DIAGNOSIS — R351 Nocturia: Secondary | ICD-10-CM

## 2022-01-18 DIAGNOSIS — N529 Male erectile dysfunction, unspecified: Secondary | ICD-10-CM

## 2022-01-18 MED ORDER — MELOXICAM 15 MG PO TABS: 15.0000 mg | ORAL_TABLET | Freq: Every day | ORAL | 0 refills | Status: DC

## 2022-01-18 MED ORDER — SILDENAFIL CITRATE 100 MG PO TABS
100.0000 mg | ORAL_TABLET | Freq: Every day | ORAL | 5 refills | Status: DC | PRN
Start: 2022-01-18 — End: 2023-04-17

## 2022-01-18 NOTE — Addendum Note (Signed)
Addended by: Dyann Kief on: 01/18/2022 08:58 AM   Modules accepted: Orders

## 2022-01-18 NOTE — Assessment & Plan Note (Signed)
Check A1c. 

## 2022-01-18 NOTE — Assessment & Plan Note (Signed)
Stable on Viagra 100 mg daily as needed.  Will refill today.

## 2022-01-18 NOTE — Assessment & Plan Note (Signed)
Continue medications per psychiatry

## 2022-01-18 NOTE — Assessment & Plan Note (Signed)
On allopurinol 100 mg daily.  We will check uric acid level today.

## 2022-01-18 NOTE — Assessment & Plan Note (Signed)
Check labs 

## 2022-01-18 NOTE — Patient Instructions (Signed)
It was very nice to see you today!  Please try the meloxicam for your legs.  We gave your pneumonia vaccine today.  Please come back for blood work.  Please continue to work on diet and exercise.  We will see you back in year for your next physical.  Come back sooner if needed.  Take care, Dr Jimmey Ralph  PLEASE NOTE:  If you had any lab tests please let us know if you have not heard back within a few days. You may see your results on mychart before we have a chance to review them but we will give you a call once they are reviewed by [place]. If we ordered any referrals today, please let us know if you have not heard from their office within the next week.   Please try these tips to maintain a healthy lifestyle:  Eat at least 3 REAL meals and 1-2 snacks per day.  Aim for no more than 5 hours between eating.  If you eat breakfast, please do so within one hour of getting up.   Each meal should contain half fruits/vegetables, one quarter protein, and one quarter carbs (no bigger than a computer mouse)  Cut down on sweet beverages. This includes juice, soda, and sweet tea.   Drink at least 1 glass of water with each meal and aim for at least 8 glasses per day  Exercise at least 150 minutes every week.    Preventive Care 51 Years and Older, Male Preventive care refers to lifestyle choices and visits with your health care provider that can promote health and wellness. Preventive care visits are also called wellness exams. What can I expect for my preventive care visit? Counseling During your preventive care visit, your health care provider may ask about your: Medical history, including: Past medical problems. Family medical history. History of falls. Current health, including: Emotional well-being. Home life and relationship well-being. Sexual activity. Memory and ability to understand (cognition). Lifestyle, including: Alcohol, nicotine or tobacco, and drug use. Access to  firearms. Diet, exercise, and sleep habits. Work and work Astronomer. Sunscreen use. Safety issues such as seatbelt and bike helmet use. Physical exam Your health care provider will check your: Height and weight. These may be used to calculate your BMI (body mass index). BMI is a measurement that tells if you are at a healthy weight. Waist circumference. This measures the distance around your waistline. This measurement also tells if you are at a healthy weight and may help predict your risk of certain diseases, such as type 2 diabetes and high blood pressure. Heart rate and blood pressure. Body temperature. Skin for abnormal spots. What immunizations do I need?  Vaccines are usually given at various ages, according to a schedule. Your health care provider will recommend vaccines for you based on your age, medical history, and lifestyle or other factors, such as travel or where you work. What tests do I need? Screening Your health care provider may recommend screening tests for certain conditions. This may include: Lipid and cholesterol levels. Diabetes screening. This is done by checking your blood sugar (glucose) after you have not eaten for a while (fasting). Hepatitis C test. Hepatitis B test. HIV (human immunodeficiency virus) test. STI (sexually transmitted infection) testing, if you are at risk. Lung cancer screening. Colorectal cancer screening. Prostate cancer screening. Abdominal aortic aneurysm (AAA) screening. You may need this if you are a current or former smoker. Talk with your health care provider about your test results, treatment  options, and if necessary, the need for more tests. Follow these instructions at home: Eating and drinking  Eat a diet that includes fresh fruits and vegetables, whole grains, lean protein, and low-fat dairy products. Limit your intake of foods with high amounts of sugar, saturated fats, and salt. Take vitamin and mineral supplements as  recommended by your health care provider. Do not drink alcohol if your health care provider tells you not to drink. If you drink alcohol: Limit how much you have to 0-2 drinks a day. Know how much alcohol is in your drink. In the [place] one drink equals one 12 oz bottle of beer (355 mL), one 5 oz glass of wine (148 mL), or one 1 oz glass of hard liquor (44 mL). Lifestyle Brush your teeth every morning and night with fluoride toothpaste. Floss one time each day. Exercise for at least 30 minutes 5 or more days each week. Do not use any products that contain nicotine or tobacco. These products include cigarettes, chewing tobacco, and vaping devices, such as e-cigarettes. If you need help quitting, ask your health care provider. Do not use drugs. If you are sexually active, practice safe sex. Use a condom or other form of protection to prevent STIs. Take aspirin only as told by your health care provider. Make sure that you understand how much to take and what form to take. Work with your health care provider to find out whether it is safe and beneficial for you to take aspirin daily. Ask your health care provider if you need to take a cholesterol-lowering medicine (statin). Find healthy ways to manage stress, such as: Meditation, yoga, or listening to music. Journaling. Talking to a trusted person. Spending time with friends and family. Safety Always wear your seat belt while driving or riding in a vehicle. Do not drive: If you have been drinking alcohol. Do not ride with someone who has been drinking. When you are tired or distracted. While texting. If you have been using any mind-altering substances or drugs. Wear a helmet and other protective equipment during sports activities. If you have firearms in your house, make sure you follow all gun safety procedures. Minimize exposure to UV radiation to reduce your risk of skin cancer. What's next? Visit your health care provider once a year for  an annual wellness visit. Ask your health care provider how often you should have your eyes and teeth checked. Stay up to date on all vaccines. This information is not intended to replace advice given to you by your health care provider. Make sure you discuss any questions you have with your health care provider. Document Revised: 10/28/2020 Document Reviewed: 10/28/2020 Elsevier Patient Education  [place].

## 2022-01-18 NOTE — Telephone Encounter (Signed)
Pt states Cologuard will be ordered for him. He is asking for it to be sent to the following address:  [address] [place], [place] [address]  He will be there temporarily for awhile.

## 2022-01-18 NOTE — Progress Notes (Signed)
Chief Complaint:  Corey [name] is a 66 y.o. male who presents today for his annual comprehensive physical exam.    Assessment/Plan:  New/Acute Problems: Leg Pain No red flags.  We discussed referral to physical therapy however he is not currently able to do this due to being in [place] 3 out of every 4 weeks.  We will start meloxicam.  He will let me know if he needs referral to see physical therapy.  We discussed reasons to return to care.  Chronic Problems Addressed Today: No problem-specific Assessment & Plan notes found for this encounter.  Preventative Healthcare: Pneumonia vaccine given today.  Check labs.  Cologuard ordered.  Patient Counseling(The following topics were reviewed and/or handout was given):  -Nutrition: Stressed importance of moderation in sodium/caffeine intake, saturated fat and cholesterol, caloric balance, sufficient intake of fresh fruits, vegetables, and fiber.  -Stressed the importance of regular exercise.   -Substance Abuse: Discussed cessation/primary prevention of tobacco, alcohol, or other drug use; driving or other dangerous activities under the influence; availability of treatment for abuse.   -Injury prevention: Discussed safety belts, safety helmets, smoke detector, smoking near bedding or upholstery.   -Sexuality: Discussed sexually transmitted diseases, partner selection, use of condoms, avoidance of unintended pregnancy and contraceptive alternatives.   -Dental health: Discussed importance of regular tooth brushing, flossing, and dental visits.  -Health maintenance and immunizations reviewed. Please refer to Health maintenance section.  Return to care in 1 year for next preventative visit.     Subjective:  HPI:  He has no acute complaints today.   Has been having issues with bilateral lateral leg pain for the last year or so.  No obvious injuries or precipitating events.  Worse with bending down and squatting.  No specific  treatments tried.     01/18/2022    8:12 AM  Depression screen PHQ 2/9  Decreased Interest 0  Down, Depressed, Hopeless 0  PHQ - 2 Score 0    Health Maintenance Due  Topic Date Due   Pneumonia Vaccine 23+ Years old (1 - PCV) Never done   Fecal DNA (Cologuard)  06/18/2021     ROS: Per HPI, otherwise a complete review of systems was negative.   PMH:  The following were reviewed and entered/updated in epic: Past Medical History:  Diagnosis Date   Depression    Gout    Patient Active Problem List   Diagnosis Date Noted   Dyslipidemia 12/30/2020   Gout 12/30/2019   GAD (generalized anxiety disorder) 12/30/2019   Erectile dysfunction 12/30/2019   Hyperglycemia 12/30/2019   Right knee meniscal tear 09/19/2018   History reviewed. No pertinent surgical history.  Family History  Adopted: Yes  Family history unknown: Yes    Medications- reviewed and updated Current Outpatient Medications  Medication Sig Dispense Refill   allopurinol (ZYLOPRIM) 100 MG tablet TAKE 1 TABLET BY MOUTH EVERY DAY 90 tablet 3   sildenafil (VIAGRA) 100 MG tablet Take 1 tablet (100 mg total) by mouth daily as needed for erectile dysfunction. 30 tablet 5   busPIRone (BUSPAR) 10 MG tablet Take 10 mg by mouth 3 (three) times daily as needed. (Patient not taking: Reported on 01/18/2022)     clonazePAM (KLONOPIN) 0.5 MG tablet Take 0.5 mg by mouth every 8 (eight) hours as needed for anxiety. (Patient not taking: Reported on 01/18/2022)     hydrOXYzine (ATARAX/VISTARIL) 25 MG tablet Take 25 mg by mouth 3 (three) times daily as needed. (Patient not taking: Reported  on 01/18/2022)     No current facility-administered medications for this visit.    Allergies-reviewed and updated No Known Allergies  Social History   Socioeconomic History   Marital status: Married    Spouse name: Not on file   Number of children: Not on file   Years of education: Not on file   Highest education level: Not on file   Occupational History   Not on file  Tobacco Use   Smoking status: Never   Smokeless tobacco: Never  Substance and Sexual Activity   Alcohol use: Yes   Drug use: No   Sexual activity: Yes  Other Topics Concern   Not on file  Social History Narrative   Not on file   Social Determinants of Health   Financial Resource Strain: Not on file  Food Insecurity: Not on file  Transportation Needs: Not on file  Physical Activity: Not on file  Stress: Not on file  Social Connections: Not on file        Objective:  Physical Exam: Temp (!) 97.5 F (36.4 C) (Temporal)   Ht 5\' 8"  (1.727 m)   Wt 236 lb 6.4 oz (107.2 kg)   BMI 35.94 kg/m   Body mass index is 35.94 kg/m. Wt Readings from Last 3 Encounters:  01/18/22 236 lb 6.4 oz (107.2 kg)  12/30/20 227 lb (103 kg)  07/09/20 231 lb 6.4 oz (105 kg)   Gen: NAD, resting comfortably HEENT: TMs normal bilaterally. OP clear. No thyromegaly noted.  CV: RRR with no murmurs appreciated Pulm: NWOB, CTAB with no crackles, wheezes, or rhonchi GI: Normal bowel sounds present. Soft, Nontender, Nondistended. MSK: no edema, cyanosis, or clubbing noted Skin: warm, dry Neuro: CN2-12 grossly intact. Strength 5/5 in upper and lower extremities. Reflexes symmetric and intact bilaterally.  Psych: Normal affect and thought content     Taylr Meuth M. 07/11/20, MD 01/18/2022 8:19 AM

## 2022-01-19 ENCOUNTER — Other Ambulatory Visit: Payer: Federal, State, Local not specified - PPO

## 2022-01-19 ENCOUNTER — Other Ambulatory Visit (INDEPENDENT_AMBULATORY_CARE_PROVIDER_SITE_OTHER): Payer: Federal, State, Local not specified - PPO

## 2022-01-19 DIAGNOSIS — Z0001 Encounter for general adult medical examination with abnormal findings: Secondary | ICD-10-CM | POA: Diagnosis not present

## 2022-01-19 DIAGNOSIS — M109 Gout, unspecified: Secondary | ICD-10-CM | POA: Diagnosis not present

## 2022-01-19 DIAGNOSIS — E785 Hyperlipidemia, unspecified: Secondary | ICD-10-CM | POA: Diagnosis not present

## 2022-01-19 DIAGNOSIS — R351 Nocturia: Secondary | ICD-10-CM | POA: Diagnosis not present

## 2022-01-19 DIAGNOSIS — R739 Hyperglycemia, unspecified: Secondary | ICD-10-CM | POA: Diagnosis not present

## 2022-01-19 LAB — LIPID PANEL
Cholesterol: 167 mg/dL (ref 0–200)
HDL: 52.8 mg/dL (ref >39.00)
LDL Cholesterol: 95 mg/dL (ref 0–99)
NonHDL: 113.95
Total CHOL/HDL Ratio: 3
Triglycerides: 97 mg/dL (ref 0.0–149.0)
VLDL: 19.4 mg/dL (ref 0.0–40.0)

## 2022-01-19 LAB — URIC ACID: Uric Acid, Serum: 6.7 mg/dL (ref 4.0–7.8)

## 2022-01-19 LAB — CBC
HCT: 43.4 % (ref 39.0–52.0)
Hemoglobin: 14.8 g/dL (ref 13.0–17.0)
MCHC: 34 g/dL (ref 30.0–36.0)
MCV: 95.6 fl (ref 78.0–100.0)
Platelets: 307 10*3/uL (ref 150.0–400.0)
RBC: 4.54 Mil/uL (ref 4.22–5.81)
RDW: 13.7 % (ref 11.5–15.5)
WBC: 7.7 10*3/uL (ref 4.0–10.5)

## 2022-01-19 LAB — HEMOGLOBIN A1C: Hgb A1c MFr Bld: 6 % (ref 4.6–6.5)

## 2022-01-19 LAB — COMPREHENSIVE METABOLIC PANEL
ALT: 35 U/L (ref 0–53)
AST: 19 U/L (ref 0–37)
Albumin: 4.2 g/dL (ref 3.5–5.2)
Alkaline Phosphatase: 72 U/L (ref 39–117)
BUN: 11 mg/dL (ref 6–23)
CO2: 24 mEq/L (ref 19–32)
Calcium: 9.3 mg/dL (ref 8.4–10.5)
Chloride: 106 mEq/L (ref 96–112)
Creatinine, Ser: 0.81 mg/dL (ref 0.40–1.50)
GFR: 92.03 mL/min (ref >60.00)
Glucose, Bld: 91 mg/dL (ref 70–99)
Potassium: 4.2 mEq/L (ref 3.5–5.1)
Sodium: 139 mEq/L (ref 135–145)
Total Bilirubin: 0.6 mg/dL (ref 0.2–1.2)
Total Protein: 7 g/dL (ref 6.0–8.3)

## 2022-01-19 LAB — PSA: PSA: 0.68 ng/mL (ref 0.10–4.00)

## 2022-01-19 LAB — TSH: TSH: 1.71 u[IU]/mL (ref 0.35–5.50)

## 2022-01-20 NOTE — Progress Notes (Signed)
Please inform patient of the following:  Labs are all stable.  Do not need to make any changes to treatment plan at this time.  He should continue to work on diet and exercise and we can recheck in a year.

## 2022-02-10 LAB — COLOGUARD: COLOGUARD: NEGATIVE

## 2022-02-15 NOTE — Progress Notes (Signed)
Please inform patient of the following:  Good news! Cologuard is negative. We can repeat in 3 years.  Corey [name]. Jerline Pain, MD 02/15/2022 1:10 PM

## 2022-02-20 ENCOUNTER — Other Ambulatory Visit: Payer: Self-pay | Admitting: Family Medicine

## 2022-02-28 ENCOUNTER — Telehealth: Payer: Self-pay | Admitting: Family Medicine

## 2022-02-28 NOTE — Telephone Encounter (Signed)
Patient requests a referral to [facility], sports medicine be placed.   Patient states: -He and PCP discussed possible referral during CPE in September.

## 2022-03-01 NOTE — Telephone Encounter (Signed)
OK for referral and dx code please

## 2022-03-02 ENCOUNTER — Other Ambulatory Visit: Payer: Self-pay

## 2022-03-02 DIAGNOSIS — M79605 Pain in left leg: Secondary | ICD-10-CM

## 2022-03-02 NOTE — Telephone Encounter (Signed)
Referral placed.

## 2022-03-02 NOTE — Telephone Encounter (Signed)
Ok to place referral for bilateral leg [name].  Corey [name]. Corey Pain, MD 03/02/2022 7:29 AM

## 2022-03-03 ENCOUNTER — Ambulatory Visit: Payer: Federal, State, Local not specified - PPO | Admitting: Sports Medicine

## 2022-03-03 ENCOUNTER — Ambulatory Visit: Payer: Self-pay

## 2022-03-03 ENCOUNTER — Ambulatory Visit (INDEPENDENT_AMBULATORY_CARE_PROVIDER_SITE_OTHER): Payer: Federal, State, Local not specified - PPO

## 2022-03-03 VITALS — BP 120/80 | HR 79 | Ht 68.0 in | Wt 235.0 lb

## 2022-03-03 DIAGNOSIS — M25569 Pain in unspecified knee: Secondary | ICD-10-CM

## 2022-03-03 DIAGNOSIS — M545 Low back pain, unspecified: Secondary | ICD-10-CM | POA: Diagnosis not present

## 2022-03-03 DIAGNOSIS — M160 Bilateral primary osteoarthritis of hip: Secondary | ICD-10-CM

## 2022-03-03 DIAGNOSIS — M47816 Spondylosis without myelopathy or radiculopathy, lumbar region: Secondary | ICD-10-CM | POA: Diagnosis not present

## 2022-03-03 DIAGNOSIS — M79605 Pain in left leg: Secondary | ICD-10-CM

## 2022-03-03 DIAGNOSIS — M79604 Pain in right leg: Secondary | ICD-10-CM | POA: Diagnosis not present

## 2022-03-03 DIAGNOSIS — R102 Pelvic and perineal pain: Secondary | ICD-10-CM | POA: Diagnosis not present
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
## 2022-03-03 NOTE — Patient Instructions (Addendum)
Good to see you  Hip HEP Pt referral  3-4 week follow up

## 2022-03-28 ENCOUNTER — Other Ambulatory Visit: Payer: Self-pay | Admitting: Family Medicine

## 2022-05-05 ENCOUNTER — Other Ambulatory Visit: Payer: Self-pay | Admitting: Family Medicine

## 2022-06-06 ENCOUNTER — Other Ambulatory Visit: Payer: Self-pay | Admitting: Family Medicine

## 2022-07-11 ENCOUNTER — Other Ambulatory Visit: Payer: Self-pay | Admitting: Family Medicine

## 2022-08-18 ENCOUNTER — Other Ambulatory Visit: Payer: Self-pay | Admitting: Family Medicine

## 2022-08-18 NOTE — Telephone Encounter (Signed)
Patient last seen Sept 2023, please advise if refill is appropriate

## 2022-09-19 ENCOUNTER — Telehealth: Payer: Self-pay | Admitting: Family Medicine

## 2022-09-19 NOTE — Telephone Encounter (Signed)
Patient is requesting to speak with PCP via telephone to discuss his hips. I offered pt and OV but he declined stating currently in [place] until the first week of June.   Pt can be reached at [phone]. Please advise if any other action needed.

## 2022-09-20 ENCOUNTER — Other Ambulatory Visit: Payer: Self-pay | Admitting: Family Medicine

## 2022-09-20 NOTE — Telephone Encounter (Signed)
Patient need OV 

## 2022-12-22 ENCOUNTER — Other Ambulatory Visit: Payer: Self-pay | Admitting: Family Medicine

## 2022-12-26 ENCOUNTER — Other Ambulatory Visit: Payer: Self-pay | Admitting: Family Medicine

## 2022-12-29 ENCOUNTER — Encounter (INDEPENDENT_AMBULATORY_CARE_PROVIDER_SITE_OTHER): Payer: Self-pay

## 2023-01-17 ENCOUNTER — Other Ambulatory Visit: Payer: Self-pay | Admitting: Family Medicine

## 2023-01-18 ENCOUNTER — Telehealth: Payer: Self-pay | Admitting: *Deleted

## 2023-01-18 NOTE — Telephone Encounter (Signed)
Patient need office visit for surgery clearance

## 2023-01-18 NOTE — Telephone Encounter (Signed)
Patient is scheduled for 01/31/23 @ 8:20 am.

## 2023-01-31 ENCOUNTER — Encounter: Payer: Federal, State, Local not specified - PPO | Admitting: Family Medicine

## 2023-01-31 DIAGNOSIS — Z0000 Encounter for general adult medical examination without abnormal findings: Secondary | ICD-10-CM

## 2023-01-31 DIAGNOSIS — Z01818 Encounter for other preprocedural examination: Secondary | ICD-10-CM

## 2023-02-03 ENCOUNTER — Ambulatory Visit (INDEPENDENT_AMBULATORY_CARE_PROVIDER_SITE_OTHER): Payer: Federal, State, Local not specified - PPO | Admitting: Family Medicine

## 2023-02-03 ENCOUNTER — Telehealth: Payer: Self-pay | Admitting: Family Medicine

## 2023-02-03 ENCOUNTER — Encounter: Payer: Self-pay | Admitting: Family Medicine

## 2023-02-03 VITALS — BP 119/75 | HR 86 | Temp 98.2°F | Ht 68.0 in | Wt 235.0 lb

## 2023-02-03 DIAGNOSIS — M109 Gout, unspecified: Secondary | ICD-10-CM

## 2023-02-03 DIAGNOSIS — E785 Hyperlipidemia, unspecified: Secondary | ICD-10-CM

## 2023-02-03 DIAGNOSIS — F411 Generalized anxiety disorder: Secondary | ICD-10-CM | POA: Diagnosis not present

## 2023-02-03 DIAGNOSIS — Z125 Encounter for screening for malignant neoplasm of prostate: Secondary | ICD-10-CM | POA: Diagnosis not present

## 2023-02-03 DIAGNOSIS — N529 Male erectile dysfunction, unspecified: Secondary | ICD-10-CM

## 2023-02-03 DIAGNOSIS — R739 Hyperglycemia, unspecified: Secondary | ICD-10-CM

## 2023-02-03 DIAGNOSIS — M1990 Unspecified osteoarthritis, unspecified site: Secondary | ICD-10-CM | POA: Insufficient documentation

## 2023-02-03 DIAGNOSIS — Z0000 Encounter for general adult medical examination without abnormal findings: Secondary | ICD-10-CM | POA: Diagnosis not present

## 2023-02-03 DIAGNOSIS — Z9889 Other specified postprocedural states: Secondary | ICD-10-CM | POA: Diagnosis not present

## 2023-02-03 DIAGNOSIS — Z0001 Encounter for general adult medical examination with abnormal findings: Secondary | ICD-10-CM

## 2023-02-03 LAB — CBC
HCT: 48.3 % (ref 39.0–52.0)
Hemoglobin: 15.8 g/dL (ref 13.0–17.0)
MCHC: 32.8 g/dL (ref 30.0–36.0)
MCV: 96.5 fl (ref 78.0–100.0)
Platelets: 339 10*3/uL (ref 150.0–400.0)
RBC: 5 Mil/uL (ref 4.22–5.81)
RDW: 14 % (ref 11.5–15.5)
WBC: 7.4 10*3/uL (ref 4.0–10.5)

## 2023-02-03 LAB — HEMOGLOBIN A1C: Hgb A1c MFr Bld: 6 % (ref 4.6–6.5)

## 2023-02-03 LAB — COMPREHENSIVE METABOLIC PANEL
ALT: 40 U/L (ref 0–53)
AST: 22 U/L (ref 0–37)
Albumin: 4.4 g/dL (ref 3.5–5.2)
Alkaline Phosphatase: 87 U/L (ref 39–117)
BUN: 16 mg/dL (ref 6–23)
CO2: 25 mEq/L (ref 19–32)
Calcium: 9.6 mg/dL (ref 8.4–10.5)
Chloride: 102 mEq/L (ref 96–112)
Creatinine, Ser: 1.4 mg/dL (ref 0.40–1.50)
GFR: 52.08 mL/min — ABNORMAL LOW (ref >60.00)
Glucose, Bld: 107 mg/dL — ABNORMAL HIGH (ref 70–99)
Potassium: 4.1 mEq/L (ref 3.5–5.1)
Sodium: 137 mEq/L (ref 135–145)
Total Bilirubin: 0.7 mg/dL (ref 0.2–1.2)
Total Protein: 7.1 g/dL (ref 6.0–8.3)

## 2023-02-03 LAB — LIPID PANEL
Cholesterol: 176 mg/dL (ref 0–200)
HDL: 54.1 mg/dL (ref >39.00)
LDL Cholesterol: 75 mg/dL (ref 0–99)
NonHDL: 122.32
Total CHOL/HDL Ratio: 3
Triglycerides: 235 mg/dL — ABNORMAL HIGH (ref 0.0–149.0)
VLDL: 47 mg/dL — ABNORMAL HIGH (ref 0.0–40.0)

## 2023-02-03 LAB — PSA: PSA: 1.19 ng/mL (ref 0.10–4.00)

## 2023-02-03 LAB — TSH: TSH: 2.03 u[IU]/mL (ref 0.35–5.50)

## 2023-02-03 LAB — URIC ACID: Uric Acid, Serum: 7.2 mg/dL (ref 4.0–7.8)

## 2023-02-03 MED ORDER — CLONAZEPAM 0.5 MG PO TABS: 0.5000 mg | ORAL_TABLET | Freq: Three times a day (TID) | ORAL | 5 refills | Status: DC | PRN

## 2023-02-03 NOTE — Assessment & Plan Note (Signed)
Will have upcoming hip replacement surgery early next week.  Will fax over surgical clearance form today.

## 2023-02-03 NOTE — Progress Notes (Signed)
Chief Complaint:  Corey [name] is a 67 y.o. male who presents today for his annual comprehensive physical exam.    Assessment/Plan:  New/Acute Problems: Encounter for Surgical Clearance EKG performed today per requesting surgeon's office.  Normal EKG with no ischemic changes.  He is optimized from a medical standpoint.  Has good excellent functional status.  Overall low risk for complication from upcoming surgery.  Will fax over clearance letter to requesting surgeon's office.  Chronic Problems Addressed Today: Osteoarthritis Will have upcoming hip replacement surgery early next week.  Will fax over surgical clearance form today.  GAD (generalized anxiety disorder) Overall stable.  He would like for [place] to take over prescription for his Valium.  He has been tolerating well.  No significant side effects.  Uses very sparingly as needed.  Will refill this today.  Instructed patient that this should continue to be used only sparingly.  We discussed potential side effects.  Discussed long-term complications of chronic benzo use.  It would be reasonable for him to continue use very sparingly as needed at this point.  If he does start to need escalating  or more frequent doses will need to restart controller medication such as BuSpar or SSRI.  Follow-up in 3 to 6 months.  Hyperglycemia Check A1c.  Discussed lifestyle modifications.  Dyslipidemia Check lipids.  Discussed lifestyle modifications.  Gout Stable.  No recent flares.  Check uric acid level today.  Continue allopurinol 300 mg daily.  Erectile dysfunction Stable on Viagra 100 mg daily as needed.  Does not need refill today.   Preventative Healthcare: Check labs.  Due for Cologuard in 2 years.  Declined flu vaccine.  Up-to-date on pneumonia and shingles vaccine.  Patient Counseling(The following topics were reviewed and/or handout was given):  -Nutrition: Stressed importance of moderation in sodium/caffeine intake,  saturated fat and cholesterol, caloric balance, sufficient intake of fresh fruits, vegetables, and fiber.  -Stressed the importance of regular exercise.   -Substance Abuse: Discussed cessation/primary prevention of tobacco, alcohol, or other drug use; driving or other dangerous activities under the influence; availability of treatment for abuse.   -Injury prevention: Discussed safety belts, safety helmets, smoke detector, smoking near bedding or upholstery.   -Sexuality: Discussed sexually transmitted diseases, partner selection, use of condoms, avoidance of unintended pregnancy and contraceptive alternatives.   -Dental health: Discussed importance of regular tooth brushing, flossing, and dental visits.  -Health maintenance and immunizations reviewed. Please refer to Health maintenance section.  Return to care in 1 year for next preventative visit.     Subjective:  HPI:  See Assessment / plan for status of chronic conditions. He is here today for his annual physical.   He would also like to have surgical clearance today. HE has a long standing history of pain in his hips secondary to osteoarthritis. He has been following with sports medicine and orthopedics for this. He has not had adequate relief with conservative management including medications and injections. He has elected to to have a hip replacement surgery. This is scheduled for 3 days from now.   He also request that we fill his Valium.  Has been on this for quite a while.  Has previously followed with psychiatry for this.  Uses very sparingly as needed.  Tolerates well.  No significant side effects.  Lifestyle Diet: Balanced. Plenty of fruits and vegetables.  Exercise: Walking. Limited due to hip pain.      02/03/2023   10:27 AM  Depression screen PHQ 2/9  Decreased Interest 0  Down, Depressed, Hopeless 0  PHQ - 2 Score 0    There are no preventive care reminders to display for this patient.    ROS: Per HPI, otherwise a  complete review of systems was negative.   PMH:  The following were reviewed and entered/updated in epic: Past Medical History:  Diagnosis Date   Depression    Gout    Patient Active Problem List   Diagnosis Date Noted   Osteoarthritis 02/03/2023   Dyslipidemia 12/30/2020   Gout 12/30/2019   GAD (generalized anxiety disorder) 12/30/2019   Erectile dysfunction 12/30/2019   Hyperglycemia 12/30/2019   Right knee meniscal tear 09/19/2018   History reviewed. No pertinent surgical history.  Family History  Adopted: Yes  Family history unknown: Yes    Medications- reviewed and updated Current Outpatient Medications  Medication Sig Dispense Refill   allopurinol (ZYLOPRIM) 100 MG tablet TAKE 1 TABLET BY MOUTH EVERY DAY 30 tablet 0   sildenafil (VIAGRA) 100 MG tablet Take 1 tablet (100 mg total) by mouth daily as needed for erectile dysfunction. 30 tablet 5   clonazePAM (KLONOPIN) 0.5 MG tablet Take 1 tablet (0.5 mg total) by mouth every 8 (eight) hours as needed for anxiety. 30 tablet 5   No current facility-administered medications for this visit.    Allergies-reviewed and updated No Known Allergies  Social History   Socioeconomic History   Marital status: Married    Spouse name: Not on file   Number of children: Not on file   Years of education: Not on file   Highest education level: Not on file  Occupational History   Not on file  Tobacco Use   Smoking status: Never   Smokeless tobacco: Never  Substance and Sexual Activity   Alcohol use: Yes   Drug use: No   Sexual activity: Yes  Other Topics Concern   Not on file  Social History Narrative   Not on file   Social Determinants of Health   Financial Resource Strain: Not on file  Food Insecurity: Not on file  Transportation Needs: Not on file  Physical Activity: Not on file  Stress: Not on file  Social Connections: Not on file        Objective:  Physical Exam: BP 119/75   Pulse 86   Temp 98.2 F  (36.8 C) (Temporal)   Ht 5\' 8"  (1.727 m)   Wt 235 lb (106.6 kg)   SpO2 99%   BMI 35.73 kg/m   Body mass index is 35.73 kg/m. Wt Readings from Last 3 Encounters:  02/03/23 235 lb (106.6 kg)  03/03/22 235 lb (106.6 kg)  01/18/22 236 lb 6.4 oz (107.2 kg)   Gen: NAD, resting comfortably HEENT: TMs normal bilaterally. OP clear. No thyromegaly noted.  CV: RRR with no murmurs appreciated Pulm: NWOB, CTAB with no crackles, wheezes, or rhonchi GI: Normal bowel sounds present. Soft, Nontender, Nondistended. MSK: no edema, cyanosis, or clubbing noted Skin: warm, dry Neuro: CN2-12 grossly intact. Strength 5/5 in upper and lower extremities. Reflexes symmetric and intact bilaterally.  Psych: Normal affect and thought content  EKG: Normal sinus rhythm.  No ischemic changes.     Katina Degree. Jimmey Ralph, MD 02/03/2023 11:06 AM

## 2023-02-03 NOTE — Telephone Encounter (Signed)
Melissa from [facility] called stating that patient is set to have surgery with them on Monday but he needed a surgical clearance form is needed. States she has been getting calls from our fax but no notes/ recent EKG results.    Caller is requesting PCP office fax over patient's most recent OV notes and EKG tracing from today to [phone] ASAP.

## 2023-02-03 NOTE — Patient Instructions (Signed)
It was very nice to see you today!  We will check blood work today.  Your EKG today is normal.  Will fax over your surgical clearance form.  I will refill your Valium.  Please let us know if you have any side effects.  Continue to work on diet and exercise.  Return in about 1 year (around 02/03/2024) for Annual Physical.   Take care, Dr Jimmey Ralph  PLEASE NOTE:  If you had any lab tests, please let us know if you have not heard back within a few days. You may see your results on mychart before we have a chance to review them but we will give you a call once they are reviewed by [place].   If we ordered any referrals today, please let us know if you have not heard from their office within the next week.   If you had any urgent prescriptions sent in today, please check with the pharmacy within an hour of our visit to make sure the prescription was transmitted appropriately.   Please try these tips to maintain a healthy lifestyle:  Eat at least 3 REAL meals and 1-2 snacks per day.  Aim for no more than 5 hours between eating.  If you eat breakfast, please do so within one hour of getting up.   Each meal should contain half fruits/vegetables, one quarter protein, and one quarter carbs (no bigger than a computer mouse)  Cut down on sweet beverages. This includes juice, soda, and sweet tea.   Drink at least 1 glass of water with each meal and aim for at least 8 glasses per day  Exercise at least 150 minutes every week.    Preventive Care 62 Years and Older, Male Preventive care refers to lifestyle choices and visits with your health care provider that can promote health and wellness. Preventive care visits are also called wellness exams. What can I expect for my preventive care visit? Counseling During your preventive care visit, your health care provider may ask about your: Medical history, including: Past medical problems. Family medical history. History of falls. Current health,  including: Emotional well-being. Home life and relationship well-being. Sexual activity. Memory and ability to understand (cognition). Lifestyle, including: Alcohol, nicotine or tobacco, and drug use. Access to firearms. Diet, exercise, and sleep habits. Work and work Astronomer. Sunscreen use. Safety issues such as seatbelt and bike helmet use. Physical exam Your health care provider will check your: Height and weight. These may be used to calculate your BMI (body mass index). BMI is a measurement that tells if you are at a healthy weight. Waist circumference. This measures the distance around your waistline. This measurement also tells if you are at a healthy weight and may help predict your risk of certain diseases, such as type 2 diabetes and high blood pressure. Heart rate and blood pressure. Body temperature. Skin for abnormal spots. What immunizations do I need?  Vaccines are usually given at various ages, according to a schedule. Your health care provider will recommend vaccines for you based on your age, medical history, and lifestyle or other factors, such as travel or where you work. What tests do I need? Screening Your health care provider may recommend screening tests for certain conditions. This may include: Lipid and cholesterol levels. Diabetes screening. This is done by checking your blood sugar (glucose) after you have not eaten for a while (fasting). Hepatitis C test. Hepatitis B test. HIV (human immunodeficiency virus) test. STI (sexually transmitted infection) testing, if  you are at risk. Lung cancer screening. Colorectal cancer screening. Prostate cancer screening. Abdominal aortic aneurysm (AAA) screening. You may need this if you are a current or former smoker. Talk with your health care provider about your test results, treatment options, and if necessary, the need for more tests. Follow these instructions at home: Eating and drinking  Eat a diet that  includes fresh fruits and vegetables, whole grains, lean protein, and low-fat dairy products. Limit your intake of foods with high amounts of sugar, saturated fats, and salt. Take vitamin and mineral supplements as recommended by your health care provider. Do not drink alcohol if your health care provider tells you not to drink. If you drink alcohol: Limit how much you have to 0-2 drinks a day. Know how much alcohol is in your drink. In the [place] one drink equals one 12 oz bottle of beer (355 mL), one 5 oz glass of wine (148 mL), or one 1 oz glass of hard liquor (44 mL). Lifestyle Brush your teeth every morning and night with fluoride toothpaste. Floss one time each day. Exercise for at least 30 minutes 5 or more days each week. Do not use any products that contain nicotine or tobacco. These products include cigarettes, chewing tobacco, and vaping devices, such as e-cigarettes. If you need help quitting, ask your health care provider. Do not use drugs. If you are sexually active, practice safe sex. Use a condom or other form of protection to prevent STIs. Take aspirin only as told by your health care provider. Make sure that you understand how much to take and what form to take. Work with your health care provider to find out whether it is safe and beneficial for you to take aspirin daily. Ask your health care provider if you need to take a cholesterol-lowering medicine (statin). Find healthy ways to manage stress, such as: Meditation, yoga, or listening to music. Journaling. Talking to a trusted person. Spending time with friends and family. Safety Always wear your seat belt while driving or riding in a vehicle. Do not drive: If you have been drinking alcohol. Do not ride with someone who has been drinking. When you are tired or distracted. While texting. If you have been using any mind-altering substances or drugs. Wear a helmet and other protective equipment during sports  activities. If you have firearms in your house, make sure you follow all gun safety procedures. Minimize exposure to UV radiation to reduce your risk of skin cancer. What's next? Visit your health care provider once a year for an annual wellness visit. Ask your health care provider how often you should have your eyes and teeth checked. Stay up to date on all vaccines. This information is not intended to replace advice given to you by your health care provider. Make sure you discuss any questions you have with your health care provider. Document Revised: 10/28/2020 Document Reviewed: 10/28/2020 Elsevier Patient Education  2024 [employer].

## 2023-02-03 NOTE — Assessment & Plan Note (Signed)
Stable on Viagra 100 mg daily as needed.  Does not need refill today.

## 2023-02-03 NOTE — Assessment & Plan Note (Signed)
Check lipids. Discussed lifestyle modifications.

## 2023-02-03 NOTE — Assessment & Plan Note (Signed)
Check A1c.  Discussed lifestyle modifications.

## 2023-02-03 NOTE — Assessment & Plan Note (Addendum)
Overall stable.  He would like for [place] to take over prescription for his Valium.  He has been tolerating well.  No significant side effects.  Uses very sparingly as needed.  Will refill this today.  Instructed patient that this should continue to be used only sparingly.  We discussed potential side effects.  Discussed long-term complications of chronic benzo use.  It would be reasonable for him to continue use very sparingly as needed at this point.  If he does start to need escalating  or more frequent doses will need to restart controller medication such as BuSpar or SSRI.  Follow-up in 3 to 6 months.

## 2023-02-03 NOTE — Assessment & Plan Note (Signed)
Stable.  No recent flares.  Check uric acid level today.  Continue allopurinol 300 mg daily.

## 2023-02-06 NOTE — Telephone Encounter (Signed)
EKG,Letter faxed on 02/03/2023 to [phone] on 02/03/2023

## 2023-02-06 NOTE — Progress Notes (Signed)
His blood sugar borderline elevated but the rest of his labs are all stable.  Do not need to make any changes to his treatment plan at this time.  He should continue to work on diet and exercise and we can recheck everything in a year or so.

## 2023-02-14 ENCOUNTER — Other Ambulatory Visit: Payer: Self-pay | Admitting: Family Medicine

## 2023-03-30 ENCOUNTER — Other Ambulatory Visit: Payer: Self-pay | Admitting: *Deleted

## 2023-03-30 MED ORDER — ALLOPURINOL 100 MG PO TABS: 100.0000 mg | ORAL_TABLET | Freq: Every day | ORAL | 1 refills | Status: DC

## 2023-04-17 ENCOUNTER — Other Ambulatory Visit: Payer: Self-pay | Admitting: Family Medicine

## 2023-05-24 ENCOUNTER — Other Ambulatory Visit: Payer: Self-pay | Admitting: Family Medicine

## 2023-07-25 ENCOUNTER — Other Ambulatory Visit: Payer: Self-pay | Admitting: Family Medicine

## 2023-09-19 ENCOUNTER — Other Ambulatory Visit: Payer: Self-pay | Admitting: Family Medicine

## 2023-11-22 ENCOUNTER — Other Ambulatory Visit: Payer: Self-pay | Admitting: Family Medicine

## 2023-12-17 ENCOUNTER — Other Ambulatory Visit: Payer: Self-pay | Admitting: Family Medicine

## 2024-01-14 ENCOUNTER — Other Ambulatory Visit: Payer: Self-pay | Admitting: Family Medicine

## 2024-01-17 ENCOUNTER — Other Ambulatory Visit: Payer: Self-pay | Admitting: Family Medicine

## 2024-02-05 ENCOUNTER — Encounter: Payer: Self-pay | Admitting: Family Medicine

## 2024-02-05 ENCOUNTER — Ambulatory Visit (INDEPENDENT_AMBULATORY_CARE_PROVIDER_SITE_OTHER): Payer: Federal, State, Local not specified - PPO | Admitting: Family Medicine

## 2024-02-05 VITALS — BP 130/65 | HR 60 | Temp 97.1°F | Ht 68.0 in | Wt 236.4 lb

## 2024-02-05 DIAGNOSIS — N529 Male erectile dysfunction, unspecified: Secondary | ICD-10-CM

## 2024-02-05 DIAGNOSIS — F411 Generalized anxiety disorder: Secondary | ICD-10-CM

## 2024-02-05 DIAGNOSIS — Z0001 Encounter for general adult medical examination with abnormal findings: Secondary | ICD-10-CM

## 2024-02-05 DIAGNOSIS — R739 Hyperglycemia, unspecified: Secondary | ICD-10-CM | POA: Diagnosis not present

## 2024-02-05 DIAGNOSIS — M109 Gout, unspecified: Secondary | ICD-10-CM

## 2024-02-05 DIAGNOSIS — M1990 Unspecified osteoarthritis, unspecified site: Secondary | ICD-10-CM

## 2024-02-05 DIAGNOSIS — E785 Hyperlipidemia, unspecified: Secondary | ICD-10-CM | POA: Diagnosis not present

## 2024-02-05 LAB — COMPREHENSIVE METABOLIC PANEL WITH GFR
ALT: 30 U/L (ref 0–53)
AST: 22 U/L (ref 0–37)
Albumin: 4.2 g/dL (ref 3.5–5.2)
Alkaline Phosphatase: 67 U/L (ref 39–117)
BUN: 13 mg/dL (ref 6–23)
CO2: 26 meq/L (ref 19–32)
Calcium: 9.5 mg/dL (ref 8.4–10.5)
Chloride: 104 meq/L (ref 96–112)
Creatinine, Ser: 0.84 mg/dL (ref 0.40–1.50)
GFR: 89.73 mL/min (ref >60.00)
Glucose, Bld: 94 mg/dL (ref 70–99)
Potassium: 4.1 meq/L (ref 3.5–5.1)
Sodium: 139 meq/L (ref 135–145)
Total Bilirubin: 0.7 mg/dL (ref 0.2–1.2)
Total Protein: 6.8 g/dL (ref 6.0–8.3)

## 2024-02-05 LAB — HEMOGLOBIN A1C: Hgb A1c MFr Bld: 6.3 % (ref 4.6–6.5)

## 2024-02-05 LAB — LIPID PANEL
Cholesterol: 155 mg/dL (ref 0–200)
HDL: 54.2 mg/dL (ref >39.00)
LDL Cholesterol: 85 mg/dL (ref 0–99)
NonHDL: 101.1
Total CHOL/HDL Ratio: 3
Triglycerides: 82 mg/dL (ref 0.0–149.0)
VLDL: 16.4 mg/dL (ref 0.0–40.0)

## 2024-02-05 LAB — CBC
HCT: 44.8 % (ref 39.0–52.0)
Hemoglobin: 14.7 g/dL (ref 13.0–17.0)
MCHC: 32.9 g/dL (ref 30.0–36.0)
MCV: 95.4 fl (ref 78.0–100.0)
Platelets: 260 K/uL (ref 150.0–400.0)
RBC: 4.7 Mil/uL (ref 4.22–5.81)
RDW: 14.1 % (ref 11.5–15.5)
WBC: 5.5 K/uL (ref 4.0–10.5)

## 2024-02-05 LAB — TSH: TSH: 1.43 u[IU]/mL (ref 0.35–5.50)

## 2024-02-05 LAB — URIC ACID: Uric Acid, Serum: 5.8 mg/dL (ref 4.0–7.8)

## 2024-02-05 MED ORDER — SILDENAFIL CITRATE 100 MG PO TABS: 100.0000 mg | ORAL_TABLET | ORAL | 3 refills | Status: AC | PRN

## 2024-02-05 MED ORDER — CLONAZEPAM 0.5 MG PO TABS: 0.5000 mg | ORAL_TABLET | Freq: Three times a day (TID) | ORAL | 5 refills | Status: AC | PRN

## 2024-02-05 NOTE — Assessment & Plan Note (Signed)
 Check 1C with labs.

## 2024-02-05 NOTE — Progress Notes (Signed)
 Chief Complaint:  Corey [name] is a 68 y.o. male who presents today for his annual comprehensive physical exam.    Assessment/Plan:  Chronic Problems Addressed Today: Gout Doing well with allopurinol  100 mg daily.  Check uric acid level today.  Hyperglycemia Check 1C with labs.  Dyslipidemia Check lipids.  Erectile dysfunction Stable on sildenafil  100 mg daily as needed.  Will refill today.  GAD (generalized anxiety disorder) Stable on clonazepam  as needed.  Uses this very sparingly.  Last refill was over a year ago.  Will refill today.  Tolerates well without significant side effects.  Osteoarthritis Follows with orthopedics.  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  Will be having left hip replacement in a few months.  Preventative Healthcare: Check labs.  Up-to-date on colon cancer screening.  Due for Cologuard next year.  He will get COVID and flu vaccine at the pharmacy soon.  Up-to-date on other vaccines.   Patient Counseling(The following topics were reviewed and/or handout was given):  -Nutrition: Stressed importance of moderation in sodium/caffeine intake, saturated fat and cholesterol, caloric balance, sufficient intake of fresh fruits, vegetables, and fiber.  -Stressed the importance of regular exercise.   -Substance Abuse: Discussed cessation/primary prevention of tobacco, alcohol, or other drug use; driving or other dangerous activities under the influence; availability of treatment for abuse.   -Injury prevention: Discussed safety belts, safety helmets, smoke detector, smoking near bedding or upholstery.   -Sexuality: Discussed sexually transmitted diseases, partner selection, use of condoms, avoidance of unintended pregnancy and contraceptive alternatives.   -Dental health: Discussed importance of regular tooth brushing, flossing, and dental visits.  -Health maintenance and immunizations reviewed. Please refer to  Health maintenance section.  Return to care in 1 year for next preventative visit.     Subjective:  HPI:  He has no acute complaints today. Patient is here today for his  annual physical.  See assessment / plan for status of chronic conditions.  Discussed the use of AI scribe software for clinical note transcription with the patient, who gave verbal consent to proceed.  History of Present Illness Corey [name] is a 68 year old male who presents for an annual physical exam.  He underwent successful hip surgery approximately one year ago and is now 90% mobile after eight weeks of recovery, having adhered strictly to physical therapy recommendations. He plans to have the other hip replaced after Joylene Snook because he is experiencing similar symptoms to those he had with his right hip two years ago.  He takes allopurinol  daily for gout management, and his gout is currently well-controlled.  He requests refills for sildenafil  and clonazepam , noting he has not received clonazepam  for a while.  He has not had time for dedicated exercise due to personal commitments but engages in physical activity through moving heavy boxes. His diet includes lots of vegetables but fewer fruits due to issues with fruit flies. He has cut down on sweets and carbs.  His wife's stepfather passed away in 02-12-24 after a decline in health due to long COVID and melanoma, which has involved significant family responsibilities, including moving and caring for aging parents.  He reports a slight decline in hearing and feels he may need a new prescription for his glasses.     Lifestyle Diet: Nonspecific.  Try to get more vegetables. Exercise: Limited.     02/05/2024    8:17 AM  Depression screen PHQ 2/9  Decreased  Interest 0  Down, Depressed, Hopeless 0  PHQ - 2 Score 0    There are no preventive care reminders to display for this patient.   ROS: Per HPI, otherwise a complete review of systems was  negative.   PMH:  The following were reviewed and entered/updated in epic: Past Medical History:  Diagnosis Date   Depression    Gout    Patient Active Problem List   Diagnosis Date Noted   Osteoarthritis 02/03/2023   Dyslipidemia 12/30/2020   Gout 12/30/2019   GAD (generalized anxiety disorder) 12/30/2019   Erectile dysfunction 12/30/2019   Hyperglycemia 12/30/2019   History reviewed. No pertinent surgical history.  Family History  Adopted: Yes  Family history unknown: Yes    Medications- reviewed and updated Current Outpatient Medications  Medication Sig Dispense Refill   allopurinol  (ZYLOPRIM ) 100 MG tablet TAKE 1 TABLET BY MOUTH EVERY DAY 30 tablet 0   clonazePAM  (KLONOPIN ) 0.5 MG tablet Take 1 tablet (0.5 mg total) by mouth every 8 (eight) hours as needed for anxiety. 30 tablet 5   sildenafil  (VIAGRA ) 100 MG tablet Take 1 tablet (100 mg total) by mouth as needed for erectile dysfunction. 30 tablet 3   No current facility-administered medications for this visit.    Allergies-reviewed and updated No Known Allergies  Social History   Socioeconomic History   Marital status: Married    Spouse name: Not on file   Number of children: Not on file   Years of education: Not on file   Highest education level: Not on file  Occupational History   Not on file  Tobacco Use   Smoking status: Never   Smokeless tobacco: Never  Substance and Sexual Activity   Alcohol use: Yes   Drug use: No   Sexual activity: Yes  Other Topics Concern   Not on file  Social History Narrative   Not on file   Social Drivers of Health   Financial Resource Strain: Not on file  Food Insecurity: Not on file  Transportation Needs: Not on file  Physical Activity: Not on file  Stress: Not on file  Social Connections: Not on file        Objective:  Physical Exam: BP 130/65   Pulse 60   Temp (!) 97.1 F (36.2 C) (Temporal)   Ht 5' 8 (1.727 m)   Wt 236 lb 6.4 oz (107.2 kg)   SpO2  97%   BMI 35.94 kg/m   Body mass index is 35.94 kg/m. Wt Readings from Last 3 Encounters:  02/05/24 236 lb 6.4 oz (107.2 kg)  02/03/23 235 lb (106.6 kg)  03/03/22 235 lb (106.6 kg)   Gen: NAD, resting comfortably HEENT: TMs normal bilaterally. OP clear. No thyromegaly noted.  CV: RRR with no murmurs appreciated Pulm: NWOB, CTAB with no crackles, wheezes, or rhonchi GI: Normal bowel sounds present. Soft, Nontender, Nondistended. MSK: no edema, cyanosis, or clubbing noted Skin: warm, dry Neuro: CN2-12 grossly intact. Strength 5/5 in upper and lower extremities. Reflexes symmetric and intact bilaterally.  Psych: Normal affect and thought content     Areej Tayler M. Kennyth, MD 02/05/2024 8:48 AM

## 2024-02-05 NOTE — Assessment & Plan Note (Signed)
 Doing well with allopurinol  100 mg daily.  Check uric acid level today.

## 2024-02-05 NOTE — Assessment & Plan Note (Signed)
 Stable on clonazepam  as needed.  Uses this very sparingly.  Last refill was over a year ago.  Will refill today.  Tolerates well without significant side effects.

## 2024-02-05 NOTE — Patient Instructions (Addendum)
 It was very nice to see you today!  VISIT SUMMARY: Today, you came in for your annual physical exam. We discussed your recovery from hip surgery, your plans for another surgery, and your current health status, including your diet, physical activity, and medications.  YOUR PLAN: ADULT WELLNESS VISIT: You are 90% mobile following hip surgery and plan another surgery after New Year's. You participate in physical activities but lack a dedicated exercise routine. Your diet includes vegetables, limited fruit, and reduced sweets and carbs. Your weight increased by one pound since last year, but your blood pressure remains good. -I encourage you to engage in more physical activity and maintain a balanced diet. -Please get a COVID-19 vaccination at a local pharmacy.  GOUT: Your gout is well-controlled with daily allopurinol . -Continue taking allopurinol  as prescribed.  ERECTILE DYSFUNCTION: You need a refill of sildenafil  and have no issues reported. -I have refilled your sildenafil  prescription.   GENERAL HEALTH MAINTENANCE: You are due for a Cologuard test next year and have received pneumonia and shingles vaccines. -Please get a COVID-19 booster and flu vaccine at a local pharmacy.  FOLLOW-UP: Your blood pressure is good, and your weight increased by one pound since last year.  Return in about 1 year (around 02/04/2025) for Annual Physical.   Take care, Dr Kennyth  PLEASE NOTE:  If you had any lab tests, please let us  know if you have not heard back within a few days. You may see your results on mychart before we have a chance to review them but we will give you a call once they are reviewed by us .   If we ordered any referrals today, please let us  know if you have not heard from their office within the next week.   If you had any urgent prescriptions sent in today, please check with the pharmacy within an hour of our visit to make sure the prescription was transmitted appropriately.    Please try these tips to maintain a healthy lifestyle:  Eat at least 3 REAL meals and 1-2 snacks per day.  Aim for no more than 5 hours between eating.  If you eat breakfast, please do so within one hour of getting up.   Each meal should contain half fruits/vegetables, one quarter protein, and one quarter carbs (no bigger than a computer mouse)  Cut down on sweet beverages. This includes juice, soda, and sweet tea.   Drink at least 1 glass of water with each meal and aim for at least 8 glasses per day  Exercise at least 150 minutes every week.     Preventive Care 73 Years and Older, Male Preventive care refers to lifestyle choices and visits with your health care provider that can promote health and wellness. Preventive care visits are also called wellness exams. What can I expect for my preventive care visit? Counseling During your preventive care visit, your health care provider may ask about your: Medical history, including: Past medical problems. Family medical history. History of falls. Current health, including: Emotional well-being. Home life and relationship well-being. Sexual activity. Memory and ability to understand (cognition). Lifestyle, including: Alcohol, nicotine or tobacco, and drug use. Access to firearms. Diet, exercise, and sleep habits. Work and work Astronomer. Sunscreen use. Safety issues such as seatbelt and bike helmet use. Physical exam Your health care provider will check your: Height and weight. These may be used to calculate your BMI (body mass index). BMI is a measurement that tells if you are at a healthy  weight. Waist circumference. This measures the distance around your waistline. This measurement also tells if you are at a healthy weight and may help predict your risk of certain diseases, such as type 2 diabetes and high blood pressure. Heart rate and blood pressure. Body temperature. Skin for abnormal spots. What immunizations do I  need?  Vaccines are usually given at various ages, according to a schedule. Your health care provider will recommend vaccines for you based on your age, medical history, and lifestyle or other factors, such as travel or where you work. What tests do I need? Screening Your health care provider may recommend screening tests for certain conditions. This may include: Lipid and cholesterol levels. Diabetes screening. This is done by checking your blood sugar (glucose) after you have not eaten for a while (fasting). Hepatitis C test. Hepatitis B test. HIV (human immunodeficiency virus) test. STI (sexually transmitted infection) testing, if you are at risk. Lung cancer screening. Colorectal cancer screening. Prostate cancer screening. Abdominal aortic aneurysm (AAA) screening. You may need this if you are a current or former smoker. Talk with your health care provider about your test results, treatment options, and if necessary, the need for more tests. Follow these instructions at home: Eating and drinking  Eat a diet that includes fresh fruits and vegetables, whole grains, lean protein, and low-fat dairy products. Limit your intake of foods with high amounts of sugar, saturated fats, and salt. Take vitamin and mineral supplements as recommended by your health care provider. Do not drink alcohol if your health care provider tells you not to drink. If you drink alcohol: Limit how much you have to 0-2 drinks a day. Know how much alcohol is in your drink. In the [place] one drink equals one 12 oz bottle of beer (355 mL), one 5 oz glass of wine (148 mL), or one 1 oz glass of hard liquor (44 mL). Lifestyle Brush your teeth every morning and night with fluoride toothpaste. Floss one time each day. Exercise for at least 30 minutes 5 or more days each week. Do not use any products that contain nicotine or tobacco. These products include cigarettes, chewing tobacco, and vaping devices, such as  e-cigarettes. If you need help quitting, ask your health care provider. Do not use drugs. If you are sexually active, practice safe sex. Use a condom or other form of protection to prevent STIs. Take aspirin only as told by your health care provider. Make sure that you understand how much to take and what form to take. Work with your health care provider to find out whether it is safe and beneficial for you to take aspirin daily. Ask your health care provider if you need to take a cholesterol-lowering medicine (statin). Find healthy ways to manage stress, such as: Meditation, yoga, or listening to music. Journaling. Talking to a trusted person. Spending time with friends and family. Safety Always wear your seat belt while driving or riding in a vehicle. Do not drive: If you have been drinking alcohol. Do not ride with someone who has been drinking. When you are tired or distracted. While texting. If you have been using any mind-altering substances or drugs. Wear a helmet and other protective equipment during sports activities. If you have firearms in your house, make sure you follow all gun safety procedures. Minimize exposure to UV radiation to reduce your risk of skin cancer. What's next? Visit your health care provider once a year for an annual wellness visit. Ask your health care  provider how often you should have your eyes and teeth checked. Stay up to date on all vaccines. This information is not intended to replace advice given to you by your health care provider. Make sure you discuss any questions you have with your health care provider. Document Revised: 10/28/2020 Document Reviewed: 10/28/2020 Elsevier Patient Education  2024 [employer].

## 2024-02-05 NOTE — Assessment & Plan Note (Signed)
 Follows with orthopedics.  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  Will be having left hip replacement in a few months.

## 2024-02-05 NOTE — Assessment & Plan Note (Signed)
 Check lipids

## 2024-02-05 NOTE — Assessment & Plan Note (Signed)
Stable on sildenafil 100 mg daily as needed.  Will refill today.

## 2024-02-07 ENCOUNTER — Ambulatory Visit: Payer: Self-pay | Admitting: Family Medicine

## 2024-02-07 NOTE — Progress Notes (Signed)
 His A1c is slightly elevated at 6.3.  This is in the prediabetic range.  Do not need to start meds for this but he should work on diet and exercise and we can recheck again in a year.  All of his other labs are at goal and we can recheck in a year.

## 2024-02-18 ENCOUNTER — Other Ambulatory Visit: Payer: Self-pay | Admitting: Family Medicine

## 2024-03-19 ENCOUNTER — Other Ambulatory Visit: Payer: Self-pay | Admitting: Family Medicine

## 2024-05-27 ENCOUNTER — Telehealth: Payer: Self-pay | Admitting: *Deleted

## 2024-05-27 NOTE — Telephone Encounter (Signed)
 Copied from CRM #[id number]. Topic: General - Other >> May 27, 2024  3:08 PM Alfonso HERO wrote: Reason for CRM: Patient getting left hip replaced in Feb and wanted inform the doctor that some forms should be sent to him in the upcoming weeks.  Noted Lanaysia Fritchman,RMA

## 2024-06-13 ENCOUNTER — Other Ambulatory Visit: Payer: Self-pay | Admitting: Family Medicine

## 2025-02-11 ENCOUNTER — Encounter: Admitting: Family Medicine
